# Patient Record
Sex: Female | Born: 1950 | Race: White | Hispanic: No | Marital: Single | Smoking: Current every day smoker
Health system: Southern US, Community
[De-identification: ages and names within clinical notes are randomized; demographics above are authoritative.]

## PROBLEM LIST (undated history)

## (undated) DIAGNOSIS — Z9889 Other specified postprocedural states: Secondary | ICD-10-CM

## (undated) DIAGNOSIS — I10 Essential (primary) hypertension: Secondary | ICD-10-CM

## (undated) DIAGNOSIS — F32A Depression, unspecified: Secondary | ICD-10-CM

## (undated) DIAGNOSIS — Z9109 Other allergy status, other than to drugs and biological substances: Secondary | ICD-10-CM

## (undated) DIAGNOSIS — R112 Nausea with vomiting, unspecified: Secondary | ICD-10-CM

## (undated) DIAGNOSIS — I2510 Atherosclerotic heart disease of native coronary artery without angina pectoris: Secondary | ICD-10-CM

## (undated) DIAGNOSIS — F329 Major depressive disorder, single episode, unspecified: Secondary | ICD-10-CM

## (undated) DIAGNOSIS — E119 Type 2 diabetes mellitus without complications: Secondary | ICD-10-CM

## (undated) DIAGNOSIS — I5189 Other ill-defined heart diseases: Secondary | ICD-10-CM

## (undated) DIAGNOSIS — I4891 Unspecified atrial fibrillation: Secondary | ICD-10-CM

## (undated) DIAGNOSIS — I509 Heart failure, unspecified: Secondary | ICD-10-CM

## (undated) DIAGNOSIS — K862 Cyst of pancreas: Secondary | ICD-10-CM

## (undated) HISTORY — PX: KNEE SURGERY: SHX244

## (undated) HISTORY — PX: CHOLECYSTECTOMY: SHX55

## (undated) HISTORY — PX: NECK SURGERY: SHX720

## (undated) HISTORY — PX: COLON SURGERY: SHX602

## (undated) HISTORY — PX: SMALL INTESTINE SURGERY: SHX150

## (undated) HISTORY — PX: ABDOMINAL HYSTERECTOMY: SHX81

## (undated) HISTORY — PX: APPENDECTOMY: SHX54

## (undated) HISTORY — PX: NASAL SINUS SURGERY: SHX719

## (undated) HISTORY — PX: CORONARY ANGIOPLASTY WITH STENT PLACEMENT: SHX49

---

## 2001-12-19 HISTORY — PX: JOINT REPLACEMENT: SHX530

## 2003-07-14 ENCOUNTER — Inpatient Hospital Stay (HOSPITAL_COMMUNITY): Admission: RE | Admit: 2003-07-14 | Discharge: 2003-07-15 | Payer: Self-pay | Admitting: Orthopaedic Surgery

## 2005-03-28 ENCOUNTER — Ambulatory Visit: Payer: Self-pay | Admitting: Family Medicine

## 2005-06-23 ENCOUNTER — Other Ambulatory Visit: Payer: Self-pay

## 2005-06-24 ENCOUNTER — Ambulatory Visit: Payer: Self-pay | Admitting: General Surgery

## 2006-01-31 ENCOUNTER — Ambulatory Visit: Payer: Self-pay

## 2006-07-06 ENCOUNTER — Ambulatory Visit: Payer: Self-pay | Admitting: *Deleted

## 2006-11-01 ENCOUNTER — Ambulatory Visit: Payer: Self-pay | Admitting: *Deleted

## 2008-02-07 ENCOUNTER — Ambulatory Visit: Payer: Self-pay | Admitting: *Deleted

## 2008-04-21 ENCOUNTER — Ambulatory Visit: Payer: Self-pay | Admitting: Gastroenterology

## 2009-10-27 ENCOUNTER — Ambulatory Visit: Payer: Self-pay | Admitting: Family Medicine

## 2010-02-04 ENCOUNTER — Ambulatory Visit: Payer: Self-pay | Admitting: Family Medicine

## 2011-04-22 ENCOUNTER — Ambulatory Visit: Payer: Self-pay | Admitting: Family Medicine

## 2014-10-15 ENCOUNTER — Ambulatory Visit: Payer: Self-pay | Admitting: Family Medicine

## 2016-11-29 DIAGNOSIS — I493 Ventricular premature depolarization: Secondary | ICD-10-CM | POA: Insufficient documentation

## 2017-01-25 ENCOUNTER — Other Ambulatory Visit: Payer: Self-pay | Admitting: Family Medicine

## 2017-01-25 DIAGNOSIS — Z13820 Encounter for screening for osteoporosis: Secondary | ICD-10-CM

## 2017-03-08 ENCOUNTER — Ambulatory Visit: Payer: Self-pay | Attending: Family Medicine

## 2017-05-06 ENCOUNTER — Emergency Department
Admission: EM | Admit: 2017-05-06 | Discharge: 2017-05-06 | Disposition: A | Discharge status: Home / Self Care | Payer: Medicare Other | Attending: Emergency Medicine | Admitting: Emergency Medicine

## 2017-05-06 ENCOUNTER — Encounter: Payer: Self-pay | Admitting: Emergency Medicine

## 2017-05-06 DIAGNOSIS — Z955 Presence of coronary angioplasty implant and graft: Secondary | ICD-10-CM | POA: Insufficient documentation

## 2017-05-06 DIAGNOSIS — E119 Type 2 diabetes mellitus without complications: Secondary | ICD-10-CM | POA: Diagnosis not present

## 2017-05-06 DIAGNOSIS — I10 Essential (primary) hypertension: Secondary | ICD-10-CM | POA: Insufficient documentation

## 2017-05-06 DIAGNOSIS — Y939 Activity, unspecified: Secondary | ICD-10-CM | POA: Insufficient documentation

## 2017-05-06 DIAGNOSIS — Z87891 Personal history of nicotine dependence: Secondary | ICD-10-CM | POA: Insufficient documentation

## 2017-05-06 DIAGNOSIS — W540XXA Bitten by dog, initial encounter: Secondary | ICD-10-CM | POA: Insufficient documentation

## 2017-05-06 DIAGNOSIS — Y929 Unspecified place or not applicable: Secondary | ICD-10-CM | POA: Diagnosis not present

## 2017-05-06 DIAGNOSIS — S61210A Laceration without foreign body of right index finger without damage to nail, initial encounter: Secondary | ICD-10-CM

## 2017-05-06 DIAGNOSIS — Y998 Other external cause status: Secondary | ICD-10-CM | POA: Insufficient documentation

## 2017-05-06 DIAGNOSIS — S61250A Open bite of right index finger without damage to nail, initial encounter: Secondary | ICD-10-CM | POA: Diagnosis not present

## 2017-05-06 DIAGNOSIS — I2510 Atherosclerotic heart disease of native coronary artery without angina pectoris: Secondary | ICD-10-CM | POA: Insufficient documentation

## 2017-05-06 DIAGNOSIS — S6991XA Unspecified injury of right wrist, hand and finger(s), initial encounter: Secondary | ICD-10-CM | POA: Diagnosis present

## 2017-05-06 HISTORY — DX: Essential (primary) hypertension: I10

## 2017-05-06 HISTORY — DX: Depression, unspecified: F32.A

## 2017-05-06 HISTORY — DX: Type 2 diabetes mellitus without complications: E11.9

## 2017-05-06 HISTORY — DX: Other ill-defined heart diseases: I51.89

## 2017-05-06 HISTORY — DX: Major depressive disorder, single episode, unspecified: F32.9

## 2017-05-06 HISTORY — DX: Atherosclerotic heart disease of native coronary artery without angina pectoris: I25.10

## 2017-05-06 HISTORY — DX: Other allergy status, other than to drugs and biological substances: Z91.09

## 2017-05-06 MED ORDER — AMOXICILLIN-POT CLAVULANATE 875-125 MG PO TABS: 1.0000 | ORAL_TABLET | Freq: Two times a day (BID) | ORAL | 0 refills | Status: DC

## 2017-05-06 MED ORDER — BACITRACIN ZINC 500 UNIT/GM EX OINT: TOPICAL_OINTMENT | Freq: Two times a day (BID) | CUTANEOUS | Status: DC

## 2017-05-06 MED ORDER — LIDOCAINE HCL (PF) 1 % IJ SOLN
5.0000 mL | Freq: Once | INTRAMUSCULAR | Status: AC
  Administered 2017-05-06: 5 mL
  Filled 2017-05-06: qty 5

## 2017-05-06 NOTE — Discharge Instructions (Signed)
Follow-up with primary care or return to the emergency department for suture removal and 10 days. Keep wound area clean, dry and covered.

## 2017-05-06 NOTE — ED Notes (Signed)
NAD noted at time of D/C. Pt denies questions or concerns. Pt ambulatory to the lobby at this time.  

## 2017-05-06 NOTE — ED Provider Notes (Signed)
[facility] Emergency Department Provider Note   ____________________________________________   I have reviewed the triage vital signs and the nursing notes.   HISTORY  Chief Complaint Animal Bite    HPI Jenny Hensley is a 66 y.o. female presents to the emergency department for a laceration on her right index finger she sustained after being bitten by her dog earlier today. Laceration is approximately 1.5 cm with some difficulty with hemorrhage control secondary to patient being on blood thinners secondary to cardiac stent placement. Patient reports canine is up-to-date on all vaccines include rabies vaccination. Patient has maintained pressure on the laceration to reduce hemorrhaging. She denies any loss of sensation to the digit at the time of the injury and since the injury. She denies any other injuries occurred. Patient denies fever, chills, headache, shortness of breath, abdominal pain, nausea and vomiting.  Past Medical History:  Diagnosis Date  . Coronary artery disease   . Depression   . Diabetes mellitus without complication ([place])   . Environmental allergies   . Heart chamber dysfunction   . Hypertension     There are no active problems to display for this patient.   Past Surgical History:  Procedure Laterality Date  . ABDOMINAL HYSTERECTOMY    . APPENDECTOMY    . CORONARY ANGIOPLASTY WITH STENT PLACEMENT    . KNEE SURGERY    . NECK SURGERY    . SMALL INTESTINE SURGERY      Prior to Admission medications   Medication Sig Start Date End Date Taking? Authorizing Provider  amoxicillin-clavulanate (AUGMENTIN) 875-125 MG tablet Take 1 tablet by mouth 2 (two) times daily. 05/06/17   Shanasia Ibrahim M, PA-C    Allergies Nsaids  History reviewed. No pertinent family history.  Social History Social History  Substance Use Topics  . Smoking status: Former Research scientist (life sciences)  . Smokeless tobacco: Never Used  . Alcohol use No    Review of  Systems Constitutional: Negative for fever/chills Eyes: No visual changes. Cardiovascular: Denies chest pain. Respiratory: Denies cough Denies shortness of breath. Musculoskeletal: Negative generalized body aches. Skin: Negative for rash. Laceration to the right index finger, DIP pad. Neurological: Negative for headaches.  Negative focal weakness or numbness. ____________________________________________   PHYSICAL EXAM:  VITAL SIGNS: ED Triage Vitals  Enc Vitals Group     BP 05/06/17 1809 (!) 181/66     Pulse Rate 05/06/17 1809 (!) 45     Resp 05/06/17 1809 18     Temp 05/06/17 1808 98.8 F (37.1 C)     Temp Source 05/06/17 1808 Oral     SpO2 05/06/17 1809 97 %     Weight 05/06/17 1808 190 lb (86.2 kg)     Height 05/06/17 1808 5\' 7"  (1.702 m)     Head Circumference --      Peak Flow --      Pain Score 05/06/17 1808 1     Pain Loc --      Pain Edu? --      Excl. in [place]? --     Constitutional: Alert and oriented. Well appearing and in no acute distress.  Head: Normocephalic and atraumatic. Eyes: Conjunctivae are normal.  Cardiovascular: Normal rate, regular rhythm. Normal distal pulses. Respiratory: Normal respiratory effort.  Musculoskeletal: Nontender with normal range of motion in all extremities. Neurologic: Normal speech and language. No gross focal neurologic deficits are appreciated. Right index finger sensation intact Skin:  Skin is warm, dry and intact. Except right index  finger DIP pad small laceration approximately 1.5 cm No rash noted. Psychiatric: Mood and affect are normal. ____________________________________________   LABS (all labs ordered are listed, but only abnormal results are displayed)  Labs Reviewed - No data to display ____________________________________________  EKG None ____________________________________________  RADIOLOGY None ____________________________________________   PROCEDURES  Procedure(s) performed: LACERATION  REPAIR Performed by: Jerolyn Shin Authorized by: Jerolyn Shin Consent: Verbal consent obtained. Risks and benefits: risks, benefits and alternatives were discussed Consent given by: patient Patient identity confirmed: provided demographic data Prepped and Draped in normal sterile fashion Wound explored  Laceration Location: Right index finger, DIP pad  Laceration Length: 1.5 cm  No Foreign Bodies seen or palpated  Anesthesia: local infiltration  Local anesthetic: lidocaine 1%   Anesthetic total: 2 ml  Irrigation method: syringe Amount of cleaning: standard  Skin closure: Ethilon 6.   Number of sutures: 3 sutures   Technique: Simple interrupted   Patient tolerance: Patient tolerated the procedure well with no immediate complications.    Critical Care performed: no ____________________________________________   INITIAL IMPRESSION / ASSESSMENT AND PLAN / ED COURSE  Pertinent labs & imaging results that were available during my care of the patient were reviewed by me and considered in my medical decision making (see chart for details).  Patient sustained a laceration the DIP pad right index finger approximately 1.5 cm secondary to being bitten by her her dog earlier today. Canine is up-to-date on all vaccinations to include rabies vaccine. Assessment confirmed movement and sensation of the digit before and after wound closure. Laceration required suture closure as noted above. Patient tolerated procedure well. Pt instructed to keep wound clean and dry and will return to the emergency department or PCP for suture removal in 10 days. Patient also instructed to watch for signs of infection and return if he noted changes of such. Patient will be provided prescription for prophylactic antibiotics.   Patient informed of clinical course, understand medical decision-making process, and agree with plan.  Patient was advised to follow up with PCP and was also advised to return  to the emergency department for symptoms that change or worsen if unable to schedule an appointment.     _________________________________________   FINAL CLINICAL IMPRESSION(S) / ED DIAGNOSES  Final diagnoses:  Laceration of right index finger without foreign body without damage to nail, initial encounter  Dog bite, initial encounter       NEW MEDICATIONS STARTED DURING THIS VISIT:  Discharge Medication List as of 05/06/2017  8:11 PM    START taking these medications   Details  amoxicillin-clavulanate (AUGMENTIN) 875-125 MG tablet Take 1 tablet by mouth 2 (two) times daily., Starting Sat 05/06/2017, Print         Note:  This document was prepared using Dragon voice recognition software and may include unintentional dictation errors.   Herberta Pickron, Laroy Apple, PA-C 05/06/17 2308    Jerolyn Shin, PA-C 05/06/17 2354    Carrie Mew, MD 05/07/17 [phone]

## 2017-05-06 NOTE — ED Notes (Signed)
Lidocaine and bacitracin given to pa

## 2017-05-06 NOTE — ED Triage Notes (Signed)
Pt reports her dog bit her last night. Small wound to right 2nd digit.  Pt c/o pain. Ambulatory to triage.

## 2017-07-12 ENCOUNTER — Other Ambulatory Visit: Payer: Self-pay | Admitting: Family Medicine

## 2017-07-12 DIAGNOSIS — Z1231 Encounter for screening mammogram for malignant neoplasm of breast: Secondary | ICD-10-CM

## 2017-07-18 ENCOUNTER — Ambulatory Visit
Admission: RE | Admit: 2017-07-18 | Discharge: 2017-07-18 | Disposition: A | Discharge status: Home / Self Care | Payer: Medicare Other | Source: Ambulatory Visit | Attending: Family Medicine | Admitting: Family Medicine

## 2017-07-18 DIAGNOSIS — Z1231 Encounter for screening mammogram for malignant neoplasm of breast: Secondary | ICD-10-CM | POA: Insufficient documentation

## 2017-07-23 DIAGNOSIS — E1169 Type 2 diabetes mellitus with other specified complication: Secondary | ICD-10-CM | POA: Insufficient documentation

## 2017-07-23 DIAGNOSIS — E1165 Type 2 diabetes mellitus with hyperglycemia: Secondary | ICD-10-CM | POA: Insufficient documentation

## 2017-07-23 DIAGNOSIS — Z794 Long term (current) use of insulin: Secondary | ICD-10-CM

## 2018-01-10 ENCOUNTER — Other Ambulatory Visit: Payer: Self-pay | Admitting: Orthopedic Surgery

## 2018-01-15 ENCOUNTER — Encounter
Admission: RE | Admit: 2018-01-15 | Discharge: 2018-01-15 | Disposition: A | Discharge status: Home / Self Care | Payer: Medicare Other | Source: Ambulatory Visit | Attending: Orthopedic Surgery | Admitting: Orthopedic Surgery

## 2018-01-15 ENCOUNTER — Other Ambulatory Visit: Payer: Self-pay

## 2018-01-15 DIAGNOSIS — Z79899 Other long term (current) drug therapy: Secondary | ICD-10-CM | POA: Diagnosis not present

## 2018-01-15 DIAGNOSIS — E119 Type 2 diabetes mellitus without complications: Secondary | ICD-10-CM | POA: Diagnosis not present

## 2018-01-15 DIAGNOSIS — Z886 Allergy status to analgesic agent status: Secondary | ICD-10-CM | POA: Diagnosis not present

## 2018-01-15 DIAGNOSIS — Z955 Presence of coronary angioplasty implant and graft: Secondary | ICD-10-CM | POA: Diagnosis not present

## 2018-01-15 DIAGNOSIS — M7552 Bursitis of left shoulder: Secondary | ICD-10-CM | POA: Diagnosis not present

## 2018-01-15 DIAGNOSIS — Z794 Long term (current) use of insulin: Secondary | ICD-10-CM | POA: Diagnosis not present

## 2018-01-15 DIAGNOSIS — M75122 Complete rotator cuff tear or rupture of left shoulder, not specified as traumatic: Secondary | ICD-10-CM | POA: Diagnosis not present

## 2018-01-15 DIAGNOSIS — F17210 Nicotine dependence, cigarettes, uncomplicated: Secondary | ICD-10-CM | POA: Diagnosis not present

## 2018-01-15 DIAGNOSIS — I4891 Unspecified atrial fibrillation: Secondary | ICD-10-CM | POA: Diagnosis not present

## 2018-01-15 DIAGNOSIS — I2510 Atherosclerotic heart disease of native coronary artery without angina pectoris: Secondary | ICD-10-CM | POA: Diagnosis not present

## 2018-01-15 DIAGNOSIS — F329 Major depressive disorder, single episode, unspecified: Secondary | ICD-10-CM | POA: Diagnosis not present

## 2018-01-15 DIAGNOSIS — I10 Essential (primary) hypertension: Secondary | ICD-10-CM | POA: Diagnosis not present

## 2018-01-15 DIAGNOSIS — Z7901 Long term (current) use of anticoagulants: Secondary | ICD-10-CM | POA: Diagnosis not present

## 2018-01-15 HISTORY — DX: Other specified postprocedural states: Z98.890

## 2018-01-15 HISTORY — DX: Nausea with vomiting, unspecified: R11.2

## 2018-01-15 HISTORY — DX: Unspecified atrial fibrillation: I48.91

## 2018-01-15 LAB — CBC WITH DIFFERENTIAL/PLATELET
BASOS ABS: 0 10*3/uL (ref 0–0.1)
Basophils Relative: 0 %
Eosinophils Absolute: 0.3 10*3/uL (ref 0–0.7)
Eosinophils Relative: 4 %
HCT: 43.9 % (ref 35.0–47.0)
HEMOGLOBIN: 14.5 g/dL (ref 12.0–16.0)
LYMPHS ABS: 2.6 10*3/uL (ref 1.0–3.6)
Lymphocytes Relative: 30 %
MCH: 31.5 pg (ref 26.0–34.0)
MCHC: 33 g/dL (ref 32.0–36.0)
MCV: 95.3 fL (ref 80.0–100.0)
Monocytes Absolute: 0.6 10*3/uL (ref 0.2–0.9)
Monocytes Relative: 7 %
NEUTROS PCT: 59 %
Neutro Abs: 5.1 10*3/uL (ref 1.4–6.5)
Platelets: 207 10*3/uL (ref 150–440)
RBC: 4.6 MIL/uL (ref 3.80–5.20)
RDW: 14.1 % (ref 11.5–14.5)
WBC: 8.6 10*3/uL (ref 3.6–11.0)

## 2018-01-15 LAB — BASIC METABOLIC PANEL
Anion gap: 6 (ref 5–15)
BUN: 32 mg/dL — AB (ref 6–20)
CO2: 27 mmol/L (ref 22–32)
CREATININE: 0.84 mg/dL (ref 0.44–1.00)
Calcium: 9.1 mg/dL (ref 8.9–10.3)
Chloride: 101 mmol/L (ref 101–111)
Glucose, Bld: 381 mg/dL — ABNORMAL HIGH (ref 65–99)
POTASSIUM: 4.3 mmol/L (ref 3.5–5.1)
SODIUM: 134 mmol/L — AB (ref 135–145)

## 2018-01-15 LAB — PROTIME-INR
INR: 1.03
PROTHROMBIN TIME: 13.4 s (ref 11.4–15.2)

## 2018-01-15 LAB — APTT: APTT: 32 s (ref 24–36)

## 2018-01-15 NOTE — Patient Instructions (Signed)
Your procedure is scheduled on: Thursday 01/18/18 Report to [facility]. To find out your arrival time please call [phone] between 1PM - 3PM on Wednesday 01/17/18.  Remember: Instructions that are not followed completely may result in serious medical risk, up to and including death, or upon the discretion of your surgeon and anesthesiologist your surgery may need to be rescheduled.     _X__ 1. Do not eat food after midnight the night before your procedure.                 No gum chewing or hard candies. You may drink clear liquids up to 2 hours                 before you are scheduled to arrive for your surgery- DO not drink clear                 liquids within 2 hours of the start of your surgery.                 Clear Liquids include:  water, apple juice without pulp, clear carbohydrate                 drink such as Clearfast of Gartorade, Black Coffee or Tea (Do not add                 anything to coffee or tea).  __X__2.  On the morning of surgery brush your teeth with toothpaste and water, you                 may rinse your mouth with mouthwash if you wish.  Do not swallow any              toothpaste of mouthwash.     _X__ 3.  No Alcohol for 24 hours before or after surgery.   _X__ 4.  Do Not Smoke or use e-cigarettes For 24 Hours Prior to Your Surgery.                 Do not use any chewable tobacco products for at least 6 hours prior to                 surgery.  ____  5.  Bring all medications with you on the day of surgery if instructed.   ____  6.  Notify your doctor if there is any change in your medical condition      (cold, fever, infections).     Do not wear jewelry, make-up, hairpins, clips or nail polish. Do not wear lotions, powders, or perfumes. You may wear deodorant. Do not shave 48 hours prior to surgery. Men may shave face and neck. Do not bring valuables to the hospital.    [facility] is not responsible for any belongings or valuables.  Contacts, dentures or bridgework may not be worn into surgery. Leave your suitcase in the car. After surgery it may be brought to your room. For patients admitted to the hospital, discharge time is determined by your treatment team.   Patients discharged the day of surgery will not be allowed to drive home.   Please read over the following fact sheets that you were given:   MRSA Information   __X__ Take these medicines the morning of surgery with A SIP OF WATER:    1. BUPROPION  2. ZETIA  3. METOPROLOL  4.  5.  6.  ____ Fleet Enema (as directed)   __X__ Use CHG Soap as directed  ____ Use inhalers on the day of surgery  ____ Stop metformin 2 days prior to surgery    __X__ Take 1/2 of usual insulin dose the night before surgery. No insulin the morning          of surgery.   __X__ Stop Coumadin/Plavix/aspirin on (XARELTO STOPPED 01/15/18)  __X__ Stop Anti-inflammatories on TODAY (IBUPROFEN)   ____ Stop supplements until after surgery.    ____ Bring C-Pap to the hospital.

## 2018-01-16 NOTE — Pre-Procedure Instructions (Signed)
Met B results sent to Dr. Mack Guise and Anesthesia for review.  Also requested H&P.

## 2018-01-16 NOTE — Pre-Procedure Instructions (Signed)
AS INSTRUCTED BY DR PISCITELLO, REQUEST/LABS CALLED AND FAXED TO DR L MILES. SPOKE WITH FRANCISCO. ALSO CALLED AND FAXED TO Ismeal Heider AT [facility]. NEEDS POPTIMIZATION OF DIABETES PREOP

## 2018-01-17 ENCOUNTER — Encounter
Admission: RE | Admit: 2018-01-17 | Discharge: 2018-01-17 | Disposition: A | Discharge status: Home / Self Care | Payer: Medicare Other | Source: Ambulatory Visit | Attending: Orthopedic Surgery | Admitting: Orthopedic Surgery

## 2018-01-17 ENCOUNTER — Other Ambulatory Visit: Payer: Self-pay | Admitting: Orthopedic Surgery

## 2018-01-17 DIAGNOSIS — M75122 Complete rotator cuff tear or rupture of left shoulder, not specified as traumatic: Secondary | ICD-10-CM | POA: Diagnosis not present

## 2018-01-17 LAB — HEMOGLOBIN A1C
HEMOGLOBIN A1C: 9.3 % — AB (ref 4.8–5.6)
Mean Plasma Glucose: 220.21 mg/dL

## 2018-01-17 MED ORDER — CEFAZOLIN SODIUM-DEXTROSE 2-4 GM/100ML-% IV SOLN
2.0000 g | INTRAVENOUS | Status: AC
  Administered 2018-01-18: 2 g via INTRAVENOUS

## 2018-01-17 NOTE — Pre-Procedure Instructions (Signed)
  Kerin Perna, RN  Registered Nurse  Anesthesiology  Pre-Procedure Instructions  Signed  Date of Service:  01/17/2018 2:10 PM       Related encounter: PRE-ADMISSION TESTING from 01/17/2018 in [place]           [] Hide copied text  [] Hover for details   Spoke with Luria Rosario at [facility] this am. CLEARANCE REQUEST SENT BY THEM TO ENDOCRINOLOGIST WHO WILL NOT CLEAR. PATIENT NEVER RETURNED FOR APPT 9/18. DR Volney American HGBAIC DONE TODAY AND RESULTS TO HIM AS SOON AS POSSIBLE.           NOTIFIED Idalie Canto AT [facility] AS WELL ORDERED WITH [email or web address].WAS TO CALL DR Mack Guise WITH GLU RESULTS ASAP. CALLED LAB 2 HOURS AFTER LABS DRAWN FOR RESULTS. LAB HAD NOT RECEIVED BLOOD. WHEN BLOOD RECEIVED,LAB CALLED AND GREEN TUBE FOR GLU. NOT SENT TO LAB. NOTIFIED [facility] [place]. WILL DO POCT GLUCOSE ON ARRIVAL

## 2018-01-17 NOTE — Pre-Procedure Instructions (Signed)
Spoke with Jenny Hensley at [facility] this am. CLEARANCE REQUEST SENT BY THEM TO ENDOCRINOLOGIST WHO WILL NOT CLEAR. PATIENT NEVER RETURNED FOR APPT 9/18. DR Volney American HGBAIC DONE TODAY AND RESULTS TO HIM AS SOON AS POSSIBLE.

## 2018-01-18 ENCOUNTER — Other Ambulatory Visit: Payer: Self-pay

## 2018-01-18 ENCOUNTER — Ambulatory Visit
Admission: RE | Admit: 2018-01-18 | Discharge: 2018-01-18 | Disposition: A | Discharge status: Home / Self Care | Payer: Medicare Other | Source: Ambulatory Visit | Attending: Orthopedic Surgery | Admitting: Orthopedic Surgery

## 2018-01-18 ENCOUNTER — Ambulatory Visit: Payer: Medicare Other | Admitting: Anesthesiology

## 2018-01-18 ENCOUNTER — Encounter: Payer: Self-pay | Admitting: *Deleted

## 2018-01-18 ENCOUNTER — Encounter
Admission: RE | Disposition: A | Discharge status: Home / Self Care | Payer: Self-pay | Source: Ambulatory Visit | Attending: Orthopedic Surgery

## 2018-01-18 DIAGNOSIS — Z955 Presence of coronary angioplasty implant and graft: Secondary | ICD-10-CM | POA: Insufficient documentation

## 2018-01-18 DIAGNOSIS — I2510 Atherosclerotic heart disease of native coronary artery without angina pectoris: Secondary | ICD-10-CM | POA: Insufficient documentation

## 2018-01-18 DIAGNOSIS — I10 Essential (primary) hypertension: Secondary | ICD-10-CM | POA: Insufficient documentation

## 2018-01-18 DIAGNOSIS — Z794 Long term (current) use of insulin: Secondary | ICD-10-CM | POA: Insufficient documentation

## 2018-01-18 DIAGNOSIS — M75122 Complete rotator cuff tear or rupture of left shoulder, not specified as traumatic: Secondary | ICD-10-CM | POA: Insufficient documentation

## 2018-01-18 DIAGNOSIS — E119 Type 2 diabetes mellitus without complications: Secondary | ICD-10-CM | POA: Insufficient documentation

## 2018-01-18 DIAGNOSIS — F329 Major depressive disorder, single episode, unspecified: Secondary | ICD-10-CM | POA: Insufficient documentation

## 2018-01-18 DIAGNOSIS — F17210 Nicotine dependence, cigarettes, uncomplicated: Secondary | ICD-10-CM | POA: Insufficient documentation

## 2018-01-18 DIAGNOSIS — M7552 Bursitis of left shoulder: Secondary | ICD-10-CM | POA: Insufficient documentation

## 2018-01-18 DIAGNOSIS — Z79899 Other long term (current) drug therapy: Secondary | ICD-10-CM | POA: Insufficient documentation

## 2018-01-18 DIAGNOSIS — I4891 Unspecified atrial fibrillation: Secondary | ICD-10-CM | POA: Insufficient documentation

## 2018-01-18 DIAGNOSIS — Z886 Allergy status to analgesic agent status: Secondary | ICD-10-CM | POA: Insufficient documentation

## 2018-01-18 DIAGNOSIS — Z7901 Long term (current) use of anticoagulants: Secondary | ICD-10-CM | POA: Insufficient documentation

## 2018-01-18 HISTORY — PX: SHOULDER ARTHROSCOPY WITH OPEN ROTATOR CUFF REPAIR: SHX6092

## 2018-01-18 LAB — GLUCOSE, CAPILLARY
Glucose-Capillary: 209 mg/dL — ABNORMAL HIGH (ref 65–99)
Glucose-Capillary: 282 mg/dL — ABNORMAL HIGH (ref 65–99)

## 2018-01-18 SURGERY — ARTHROSCOPY, SHOULDER WITH REPAIR, ROTATOR CUFF, OPEN
Anesthesia: General | Site: Shoulder | Laterality: Left | Wound class: Clean

## 2018-01-18 MED ORDER — MIDAZOLAM HCL 2 MG/2ML IJ SOLN
INTRAMUSCULAR | Status: AC
  Filled 2018-01-18: qty 2

## 2018-01-18 MED ORDER — BUPIVACAINE HCL (PF) 0.25 % IJ SOLN
INTRAMUSCULAR | Status: DC | PRN
  Administered 2018-01-18: 30 mL

## 2018-01-18 MED ORDER — CHLORHEXIDINE GLUCONATE CLOTH 2 % EX PADS: 6.0000 | MEDICATED_PAD | Freq: Once | CUTANEOUS | Status: DC

## 2018-01-18 MED ORDER — ONDANSETRON HCL 4 MG/2ML IJ SOLN
INTRAMUSCULAR | Status: AC
  Filled 2018-01-18: qty 2

## 2018-01-18 MED ORDER — FAMOTIDINE 20 MG PO TABS
20.0000 mg | ORAL_TABLET | Freq: Once | ORAL | Status: AC
  Administered 2018-01-18: 20 mg via ORAL

## 2018-01-18 MED ORDER — BUPIVACAINE HCL (PF) 0.25 % IJ SOLN
INTRAMUSCULAR | Status: AC
  Filled 2018-01-18: qty 30

## 2018-01-18 MED ORDER — MIDAZOLAM HCL 2 MG/2ML IJ SOLN
2.0000 mg | Freq: Once | INTRAMUSCULAR | Status: AC
  Administered 2018-01-18: 2 mg via INTRAVENOUS

## 2018-01-18 MED ORDER — SODIUM CHLORIDE FLUSH 0.9 % IV SOLN
INTRAVENOUS | Status: AC
  Filled 2018-01-18: qty 10

## 2018-01-18 MED ORDER — LIDOCAINE HCL (PF) 1.5 % IJ SOLN
INTRAMUSCULAR | Status: DC | PRN
  Administered 2018-01-18: 3 mL via INTRADERMAL

## 2018-01-18 MED ORDER — LACTATED RINGERS IV SOLN
INTRAVENOUS | Status: DC | PRN
  Administered 2018-01-18: 08:00:00 via INTRAVENOUS

## 2018-01-18 MED ORDER — PROPOFOL 10 MG/ML IV BOLUS
INTRAVENOUS | Status: DC | PRN
  Administered 2018-01-18: 130 mg via INTRAVENOUS

## 2018-01-18 MED ORDER — NEOMYCIN-POLYMYXIN B GU 40-200000 IR SOLN
Status: DC | PRN
  Administered 2018-01-18: 2 mL

## 2018-01-18 MED ORDER — CEFAZOLIN SODIUM-DEXTROSE 2-4 GM/100ML-% IV SOLN
INTRAVENOUS | Status: AC
  Filled 2018-01-18: qty 100

## 2018-01-18 MED ORDER — INSULIN ASPART 100 UNIT/ML ~~LOC~~ SOLN
SUBCUTANEOUS | Status: AC
  Administered 2018-01-18: 5 [IU] via SUBCUTANEOUS
  Filled 2018-01-18: qty 1

## 2018-01-18 MED ORDER — FENTANYL CITRATE (PF) 100 MCG/2ML IJ SOLN
INTRAMUSCULAR | Status: DC | PRN
  Administered 2018-01-18 (×2): 50 ug via INTRAVENOUS

## 2018-01-18 MED ORDER — PHENYLEPHRINE HCL 10 MG/ML IJ SOLN
INTRAMUSCULAR | Status: DC | PRN
  Administered 2018-01-18 (×7): 100 ug via INTRAVENOUS

## 2018-01-18 MED ORDER — SUGAMMADEX SODIUM 200 MG/2ML IV SOLN
INTRAVENOUS | Status: DC | PRN
  Administered 2018-01-18: 199.6 mg via INTRAVENOUS

## 2018-01-18 MED ORDER — FENTANYL CITRATE (PF) 100 MCG/2ML IJ SOLN: 25.0000 ug | INTRAMUSCULAR | Status: DC | PRN

## 2018-01-18 MED ORDER — SUCCINYLCHOLINE CHLORIDE 20 MG/ML IJ SOLN
INTRAMUSCULAR | Status: DC | PRN
  Administered 2018-01-18: 100 mg via INTRAVENOUS

## 2018-01-18 MED ORDER — EPINEPHRINE 30 MG/30ML IJ SOLN
INTRAMUSCULAR | Status: AC
  Filled 2018-01-18: qty 1

## 2018-01-18 MED ORDER — FENTANYL CITRATE (PF) 250 MCG/5ML IJ SOLN
INTRAMUSCULAR | Status: AC
  Filled 2018-01-18: qty 5

## 2018-01-18 MED ORDER — ROPIVACAINE HCL 5 MG/ML IJ SOLN
INTRAMUSCULAR | Status: DC | PRN
  Administered 2018-01-18: 30 mL via PERINEURAL

## 2018-01-18 MED ORDER — SUGAMMADEX SODIUM 200 MG/2ML IV SOLN
INTRAVENOUS | Status: AC
  Filled 2018-01-18: qty 2

## 2018-01-18 MED ORDER — NEOMYCIN-POLYMYXIN B GU 40-200000 IR SOLN
Status: AC
  Filled 2018-01-18: qty 2

## 2018-01-18 MED ORDER — CEFAZOLIN SODIUM-DEXTROSE 2-4 GM/100ML-% IV SOLN: 2.0000 g | INTRAVENOUS | Status: DC

## 2018-01-18 MED ORDER — SCOPOLAMINE 1 MG/3DAYS TD PT72
MEDICATED_PATCH | TRANSDERMAL | Status: AC
  Administered 2018-01-18: 1.5 mg via TRANSDERMAL
  Filled 2018-01-18: qty 1

## 2018-01-18 MED ORDER — METOPROLOL TARTRATE 5 MG/5ML IV SOLN
INTRAVENOUS | Status: DC | PRN
  Administered 2018-01-18 (×4): 1 mg via INTRAVENOUS

## 2018-01-18 MED ORDER — PROMETHAZINE HCL 25 MG/ML IJ SOLN
12.5000 mg | Freq: Once | INTRAMUSCULAR | Status: AC
  Administered 2018-01-18: 12.5 mg via INTRAVENOUS

## 2018-01-18 MED ORDER — LIDOCAINE HCL (CARDIAC) 20 MG/ML IV SOLN
INTRAVENOUS | Status: DC | PRN
  Administered 2018-01-18: 40 mg via INTRAVENOUS

## 2018-01-18 MED ORDER — MIDAZOLAM HCL 2 MG/2ML IJ SOLN: 1.0000 mg | Freq: Once | INTRAMUSCULAR | Status: DC

## 2018-01-18 MED ORDER — PROPOFOL 10 MG/ML IV BOLUS
INTRAVENOUS | Status: AC
  Filled 2018-01-18: qty 20

## 2018-01-18 MED ORDER — FAMOTIDINE 20 MG PO TABS
ORAL_TABLET | ORAL | Status: AC
  Administered 2018-01-18: 20 mg via ORAL
  Filled 2018-01-18: qty 1

## 2018-01-18 MED ORDER — SUCCINYLCHOLINE CHLORIDE 20 MG/ML IJ SOLN
INTRAMUSCULAR | Status: AC
  Filled 2018-01-18: qty 1

## 2018-01-18 MED ORDER — PROMETHAZINE HCL 25 MG/ML IJ SOLN
INTRAMUSCULAR | Status: AC
  Administered 2018-01-18: 12.5 mg via INTRAVENOUS
  Filled 2018-01-18: qty 1

## 2018-01-18 MED ORDER — OXYCODONE HCL 5 MG PO TABS: 5.0000 mg | ORAL_TABLET | ORAL | 0 refills | Status: DC | PRN

## 2018-01-18 MED ORDER — SCOPOLAMINE 1 MG/3DAYS TD PT72
1.0000 | MEDICATED_PATCH | TRANSDERMAL | Status: DC
  Administered 2018-01-18: 1.5 mg via TRANSDERMAL

## 2018-01-18 MED ORDER — SEVOFLURANE IN SOLN
RESPIRATORY_TRACT | Status: AC
  Filled 2018-01-18: qty 250

## 2018-01-18 MED ORDER — EPINEPHRINE PF 1 MG/ML IJ SOLN
INTRAMUSCULAR | Status: DC | PRN
  Administered 2018-01-18: 10 mL

## 2018-01-18 MED ORDER — LIDOCAINE HCL 2 % EX GEL
CUTANEOUS | Status: DC | PRN
  Administered 2018-01-18: 3

## 2018-01-18 MED ORDER — PHENYLEPHRINE 8 MG IN D5W 100 ML (0.08MG/ML) PREMIX OPTIME
INJECTION | INTRAVENOUS | Status: DC | PRN
  Administered 2018-01-18: 30 ug/min via INTRAVENOUS

## 2018-01-18 MED ORDER — ONDANSETRON HCL 4 MG PO TABS: 4.0000 mg | ORAL_TABLET | Freq: Three times a day (TID) | ORAL | 0 refills | Status: DC | PRN

## 2018-01-18 MED ORDER — SODIUM CHLORIDE 0.9 % IV SOLN
INTRAVENOUS | Status: DC
  Administered 2018-01-18: 07:00:00 via INTRAVENOUS

## 2018-01-18 MED ORDER — DEXAMETHASONE SODIUM PHOSPHATE 10 MG/ML IJ SOLN
INTRAMUSCULAR | Status: DC | PRN
  Administered 2018-01-18: 8 mg via INTRAVENOUS

## 2018-01-18 MED ORDER — ROPIVACAINE HCL 5 MG/ML IJ SOLN
INTRAMUSCULAR | Status: AC
  Filled 2018-01-18: qty 30

## 2018-01-18 MED ORDER — LIDOCAINE HCL (PF) 1 % IJ SOLN
INTRAMUSCULAR | Status: AC
  Filled 2018-01-18: qty 30

## 2018-01-18 MED ORDER — ROCURONIUM BROMIDE 100 MG/10ML IV SOLN
INTRAVENOUS | Status: DC | PRN
  Administered 2018-01-18: 10 mg via INTRAVENOUS
  Administered 2018-01-18: 45 mg via INTRAVENOUS
  Administered 2018-01-18: 5 mg via INTRAVENOUS

## 2018-01-18 MED ORDER — INSULIN ASPART 100 UNIT/ML ~~LOC~~ SOLN
5.0000 [IU] | Freq: Once | SUBCUTANEOUS | Status: AC
  Administered 2018-01-18: 5 [IU] via SUBCUTANEOUS

## 2018-01-18 MED ORDER — ACETAMINOPHEN 10 MG/ML IV SOLN
INTRAVENOUS | Status: AC
  Filled 2018-01-18: qty 100

## 2018-01-18 MED ORDER — ROCURONIUM BROMIDE 50 MG/5ML IV SOLN
INTRAVENOUS | Status: AC
  Filled 2018-01-18: qty 1

## 2018-01-18 MED ORDER — FENTANYL CITRATE (PF) 100 MCG/2ML IJ SOLN
INTRAMUSCULAR | Status: AC
  Filled 2018-01-18: qty 2

## 2018-01-18 MED ORDER — FENTANYL CITRATE (PF) 100 MCG/2ML IJ SOLN: 50.0000 ug | Freq: Once | INTRAMUSCULAR | Status: DC

## 2018-01-18 MED ORDER — MIDAZOLAM HCL 2 MG/2ML IJ SOLN
INTRAMUSCULAR | Status: AC
  Administered 2018-01-18: 2 mg via INTRAVENOUS
  Filled 2018-01-18: qty 2

## 2018-01-18 MED ORDER — LIDOCAINE HCL (PF) 1 % IJ SOLN
INTRAMUSCULAR | Status: AC
  Filled 2018-01-18: qty 5

## 2018-01-18 MED ORDER — ONDANSETRON HCL 4 MG/2ML IJ SOLN
4.0000 mg | Freq: Once | INTRAMUSCULAR | Status: AC | PRN
  Administered 2018-01-18: 4 mg via INTRAVENOUS

## 2018-01-18 MED ORDER — ACETAMINOPHEN 10 MG/ML IV SOLN
INTRAVENOUS | Status: DC | PRN
  Administered 2018-01-18: 1000 mg via INTRAVENOUS

## 2018-01-18 SURGICAL SUPPLY — 76 items
ADAPTER IRRIG TUBE 2 SPIKE SOL (ADAPTER) ×6 IMPLANT
ANCHOR ALL-SUT Q-FIX 2.8 (Anchor) ×3 IMPLANT
BUR RADIUS 4.0X18.5 (BURR) ×3 IMPLANT
BUR RADIUS 5.5 (BURR) ×3 IMPLANT
CANNULA 5.75X7 CRYSTAL CLEAR (CANNULA) ×6 IMPLANT
CANNULA PARTIAL THREAD 2X7 (CANNULA) ×3 IMPLANT
CANNULA TWIST IN 8.25X9CM (CANNULA) IMPLANT
CLOSURE WOUND 1/2 X4 (GAUZE/BANDAGES/DRESSINGS) ×2
CONNECTOR PERFECT PASSER (CONNECTOR) ×3 IMPLANT
COOLER POLAR GLACIER W/PUMP (MISCELLANEOUS) ×3 IMPLANT
CRADLE LAMINECT ARM (MISCELLANEOUS) ×3 IMPLANT
DEVICE SUCT BLK HOLE OR FLOOR (MISCELLANEOUS) ×3 IMPLANT
DRAPE IMP U-DRAPE 54X76 (DRAPES) ×6 IMPLANT
DRAPE INCISE IOBAN 66X45 STRL (DRAPES) ×3 IMPLANT
DRAPE SHEET LG 3/4 BI-LAMINATE (DRAPES) ×3 IMPLANT
DRAPE U-SHAPE 47X51 STRL (DRAPES) IMPLANT
DURAPREP 26ML APPLICATOR (WOUND CARE) ×12 IMPLANT
ELECT REM PT RETURN 9FT ADLT (ELECTROSURGICAL) ×3
ELECTRODE REM PT RTRN 9FT ADLT (ELECTROSURGICAL) ×1 IMPLANT
GAUZE PETRO XEROFOAM 1X8 (MISCELLANEOUS) ×3 IMPLANT
GAUZE SPONGE 4X4 12PLY STRL (GAUZE/BANDAGES/DRESSINGS) ×6 IMPLANT
GLOVE BIOGEL PI IND STRL 7.0 (GLOVE) ×5 IMPLANT
GLOVE BIOGEL PI IND STRL 9 (GLOVE) ×1 IMPLANT
GLOVE BIOGEL PI INDICATOR 7.0 (GLOVE) ×10
GLOVE BIOGEL PI INDICATOR 9 (GLOVE) ×2
GLOVE PROTEXIS LATEX SZ 7.5 (GLOVE) ×15 IMPLANT
GLOVE SURG 9.0 ORTHO LTXF (GLOVE) ×6 IMPLANT
GOWN STRL REUS TWL 2XL XL LVL4 (GOWN DISPOSABLE) ×3 IMPLANT
GOWN STRL REUS W/ TWL LRG LVL3 (GOWN DISPOSABLE) ×1 IMPLANT
GOWN STRL REUS W/ TWL LRG LVL4 (GOWN DISPOSABLE) ×3 IMPLANT
GOWN STRL REUS W/TWL LRG LVL3 (GOWN DISPOSABLE) ×2
GOWN STRL REUS W/TWL LRG LVL4 (GOWN DISPOSABLE) ×6
IV LACTATED RINGER IRRG 3000ML (IV SOLUTION) ×20
IV LR IRRIG 3000ML ARTHROMATIC (IV SOLUTION) ×10 IMPLANT
KIT RM TURNOVER STRD PROC AR (KITS) ×3 IMPLANT
KIT STABILIZATION SHOULDER (MISCELLANEOUS) ×3 IMPLANT
KIT SUTURE 2.8 Q-FIX DISP (MISCELLANEOUS) ×3 IMPLANT
KIT SUTURETAK 3.0 INSERT PERC (KITS) IMPLANT
MANIFOLD NEPTUNE II (INSTRUMENTS) ×3 IMPLANT
MASK FACE SPIDER DISP (MASK) ×3 IMPLANT
MAT BLUE FLOOR 46X72 FLO (MISCELLANEOUS) ×6 IMPLANT
NDL SAFETY ECLIPSE 18X1.5 (NEEDLE) ×1 IMPLANT
NEEDLE HYPO 18GX1.5 SHARP (NEEDLE) ×2
NEEDLE HYPO 22GX1.5 SAFETY (NEEDLE) ×3 IMPLANT
NS IRRIG 500ML POUR BTL (IV SOLUTION) ×3 IMPLANT
PACK ARTHROSCOPY SHOULDER (MISCELLANEOUS) ×3 IMPLANT
PAD WRAPON POLAR SHDR XLG (MISCELLANEOUS) ×1 IMPLANT
PASSER SUT CAPTURE FIRST (SUTURE) ×3 IMPLANT
SET TUBE SUCT SHAVER OUTFL 24K (TUBING) ×3 IMPLANT
SET TUBE TIP INTRA-ARTICULAR (MISCELLANEOUS) ×3 IMPLANT
SLING ULTRA II M (MISCELLANEOUS) ×3 IMPLANT
STRAP SAFETY BODY (MISCELLANEOUS) ×3 IMPLANT
STRIP CLOSURE SKIN 1/2X4 (GAUZE/BANDAGES/DRESSINGS) ×4 IMPLANT
SUT ETHILON 4-0 (SUTURE) ×4
SUT ETHILON 4-0 FS2 18XMFL BLK (SUTURE) ×2
SUT KNTLS 2.8 MAGNUM (Anchor) ×6 IMPLANT
SUT LASSO 90 DEG SD STR (SUTURE) IMPLANT
SUT MNCRL 4-0 (SUTURE) ×2
SUT MNCRL 4-0 27XMFL (SUTURE) ×1
SUT PDS AB 0 CT1 27 (SUTURE) ×3 IMPLANT
SUT PERFECTPASSER WHITE CART (SUTURE) IMPLANT
SUT SMART STITCH CARTRIDGE (SUTURE) ×6 IMPLANT
SUT VIC AB 0 CT1 36 (SUTURE) ×3 IMPLANT
SUT VIC AB 2-0 CT2 27 (SUTURE) ×3 IMPLANT
SUTURE ETHLN 4-0 FS2 18XMF BLK (SUTURE) ×2 IMPLANT
SUTURE MAGNUM WIRE 2X48 BLK (SUTURE) IMPLANT
SUTURE MNCRL 4-0 27XMF (SUTURE) ×1 IMPLANT
SYR 10ML 18GX1 1/2 (NEEDLE) ×3 IMPLANT
SYR 10ML LL (SYRINGE) ×3 IMPLANT
SYR 3ML 18GX1 1/2 (SYRINGE) ×3 IMPLANT
TAPE MICROFOAM 4IN (TAPE) ×3 IMPLANT
TUBING ARTHRO INFLOW-ONLY STRL (TUBING) ×3 IMPLANT
TUBING CONNECTING 10 (TUBING) ×2 IMPLANT
TUBING CONNECTING 10' (TUBING) ×1
WAND HAND CNTRL MULTIVAC 90 (MISCELLANEOUS) ×3 IMPLANT
WRAPON POLAR PAD SHDR XLG (MISCELLANEOUS) ×3

## 2018-01-18 NOTE — Anesthesia Post-op Follow-up Note (Signed)
Anesthesia QCDR form completed.        

## 2018-01-18 NOTE — Anesthesia Procedure Notes (Signed)
Anesthesia Regional Block: Interscalene brachial plexus block   Pre-Anesthetic Checklist: ,, timeout performed, Correct Patient, Correct Site, Correct Laterality, Correct Procedure, Correct Position, site marked, Risks and benefits discussed,  Surgical consent,  Pre-op evaluation,  At surgeon's request and post-op pain management  Laterality: Left  Prep: chloraprep       Needles:  Injection technique: Single-shot  Needle Type: Echogenic Stimulator Needle     Needle Length: 10cm  Needle Gauge: 22     Additional Needles:   Procedures:, nerve stimulator,,, ultrasound used (permanent image in chart),,,,   Nerve Stimulator or Paresthesia:  Response: biceps flexion,   Additional Responses:   Narrative:  Injection made incrementally with aspirations every 5 mL.  Performed by: Personally  Anesthesiologist: Molli Barrows, MD  Additional Notes: Functioning IV was confirmed and monitors were applied.  A 50mm 22ga Stimuplex needle was used. Sterile prep and drape,hand hygiene and sterile gloves were used.  Negative aspiration and negative test dose prior to incremental administration of local anesthetic. The patient tolerated the procedure well.

## 2018-01-18 NOTE — Discharge Instructions (Signed)
Interscalene Nerve Block (ISNB) Discharge Instructions   1.  For your surgery you have received an Interscalene Nerve Block. 2. Nerve Blocks affect many types of nerves, including nerves that control movement, pain and normal sensation.  You may experience feelings such as numbness, tingling, heaviness, weakness or the inability to move your arm or the feeling or sensation that your arm has "fallen asleep". 3. A nerve block can last for 2 - 36 hours or more depending on the medication used.  Usually the weakness wears off first.  The tingling and heaviness usually wear off next.  Finally you may start to notice pain.  Keep in mind that this may occur in any order.  once a nerve block starts to wear off it is usually completely gone within 60 minutes. 4. ISNB may cause mild shortness of breath, a hoarse voice, blurry vision, unequal pupils, or drooping of the face on the same side as the nerve block.  These symptoms will usually go away within 12 hours.  Very rarely the procedure itself can cause mild seizures. 5. If needed, your surgeon will give you a prescription for pain medication.  It will take about 60 minutes for the oral pain medication to become fully effective.  So, it is recommended that you start taking this medication before the nerve block first begins to wear off, or when you first begin to feel discomfort. 6. Keep in mind that nerve blocks often wear off in the middle of the night.   If you are going to bed and the block has not started to wear off or you have not started to have any discomfort, consider setting an alarm for 2 to 3 hours, so you can assess your block.  If you notice the block is wearing off or you are starting to have discomfort, you can take your pain medication. 7. Take your pain medication only as prescribed.  Pain medication can cause sedation and decrease your breathing if you take more than you need for the level of pain that you have. 8. Nausea is a common side effect  of many pain medications.  You may want to eat something before taking your pain medicine to prevent nausea. 9. After an Interscalene nerve block, you cannot feel pain, pressure or extremes in temperature in the effected arm.  Because your arm is numb it is at an increased risk for injury.  To decrease the possibility of injury, please practice the following:  a. While you are awake change the position of your arm frequently to prevent too much pressure on any one area for prolonged periods of time. b.  If you have a cast or tight dressing, check the color or your fingers every couple of hours.  Call your surgeon with the appearance of any discoloration (white or blue). c. If you are given a sling to wear before you go home, please wear it  at all times until the block has completely worn off.  Do not get up at night without your sling. d. If you experience any problems or concerns, please contact your surgeon's office. e. If you experience severe or prolonged shortness of breath go to the nearest emergency department.  AMBULATORY SURGERY  DISCHARGE INSTRUCTIONS   1) The drugs that you were given will stay in your system until tomorrow so for the next 24 hours you should not:  A) Drive an automobile B) Make any legal decisions C) Drink any alcoholic beverage   2) You  may resume regular meals tomorrow.  Today it is better to start with liquids and gradually work up to solid foods.  You may eat anything you prefer, but it is better to start with liquids, then soup and crackers, and gradually work up to solid foods.   3) Please notify your doctor immediately if you have any unusual bleeding, trouble breathing, redness and pain at the surgery site, drainage, fever, or pain not relieved by medication.    4) Additional Instructions:   Please contact your physician with any problems or Same Day Surgery at [phone], Monday through Friday 6 am to 4 pm, or [facility]  number at [phone].

## 2018-01-18 NOTE — Progress Notes (Signed)
Inpatient Diabetes Program Recommendations  AACE/ADA: New Consensus Statement on Inpatient Glycemic Control (2015)  Target Ranges:  Prepandial:   less than 140 mg/dL      Peak postprandial:   less than 180 mg/dL (1-2 hours)      Critically ill patients:  140 - 180 mg/dL   Lab Results  Component Value Date   GLUCAP 282 (H) 01/18/2018   HGBA1C 9.3 (H) 01/17/2018    Inpatient Diabetes Program Recommendations:  Spoke to RN Eve regarding patient's high A1C of 9.3% (average blood sugar 220mg /dl).  Patient sees Dr. Gabriel Carina for her diabetes care- asked RN to stress to the patient,  the need for ideal blood sugars for success of heeling post surgery.  Dr. Gabriel Carina has asked the patient to check blood sugars 4x/day, before meals and at hs- RN will stress this to the patient.  Gentry Fitz, RN, BA, MHA, CDE Diabetes Coordinator Inpatient Diabetes Program  [phone] (Team Pager) [phone] ([place]) 01/18/2018 11:31 AM

## 2018-01-18 NOTE — H&P (Signed)
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 Symptoms are rated as moderate to severe, and have been worsening.  This is significantly impairing activities of daily living.  She has elected for surgical management.   Past Medical History:  Diagnosis Date  . A-fib ([facility])   . Coronary artery disease   . Depression   . Diabetes mellitus without complication ([facility])   . Environmental allergies   . Heart chamber dysfunction   . Hypertension   . PONV (postoperative nausea and vomiting)    Past Surgical History:  Procedure Laterality Date  . ABDOMINAL HYSTERECTOMY    . APPENDECTOMY    . CHOLECYSTECTOMY    . COLON SURGERY    . CORONARY ANGIOPLASTY WITH STENT PLACEMENT    . JOINT REPLACEMENT Left 2003   knee  . KNEE SURGERY    . NASAL SINUS SURGERY    . NECK SURGERY    . SMALL INTESTINE SURGERY     Social History   Socioeconomic History  . Marital status: Single    Spouse name: None  . Number of children: None  . Years of education: None  . Highest education level: None  Social Needs  . Financial resource strain: None  . Food insecurity - worry: None  . Food insecurity - inability: None  . Transportation needs - medical: None  . Transportation needs - non-medical: None  Occupational History  . None  Tobacco Use  . Smoking status: Current Every Day Smoker    Packs/day: 0.25  . Smokeless tobacco: Never Used  Substance and Sexual Activity  . Alcohol use: No  . Drug use: No  . Sexual activity: None  Other Topics Concern  . None  Social History Narrative  . None   Family History  Problem Relation Age of Onset  . Breast cancer Neg Hx    Allergies  Allergen Reactions  . Nsaids Hives    Can take otc ibuprofen   Prior to  Admission medications   Medication Sig Start Date End Date Taking? Authorizing Provider  acetaminophen (TYLENOL 8 HOUR ARTHRITIS PAIN) 650 MG CR tablet Take 1,950 mg by mouth 2 (two) times daily as needed for pain.   Yes [provider]  buPROPion (WELLBUTRIN XL) 150 MG 24 hr tablet Take 150 mg by mouth 2 (two) times daily.   Yes [provider]  Chlorpheniramine-APAP (CORICIDIN) 2-325 MG TABS Take 1-2 tablets by mouth 4 (four) times daily as needed (for congestion.).   Yes [provider]  diphenhydrAMINE (BENADRYL ALLERGY) 25 MG tablet Take 25 mg by mouth 2 (two) times daily as needed (for allergies/congestion.).   Yes [provider]  ezetimibe (ZETIA) 10 MG tablet Take 10 mg by mouth daily.   Yes [provider]  fluticasone (FLONASE) 50 MCG/ACT nasal spray Place 1-2 sprays into both nostrils daily as needed for allergies or rhinitis.   Yes [provider]  glyBURIDE (DIABETA) 5 MG tablet Take 10 mg by mouth 2 (two) times daily.   Yes [provider]  Insulin Detemir (LEVEMIR FLEXTOUCH) 100 UNIT/ML Pen Inject 60 Units into the skin 2 (two) times daily.    Yes [provider]  insulin lispro (HUMALOG KWIKPEN) 100 UNIT/ML KiwkPen Inject 20-40 Units into the skin 5 (five) times   daily. SLIDING SCALE INSULIN   Yes [provider]  linagliptin (TRADJENTA) 5 MG TABS tablet Take 5 mg by mouth daily.   Yes [provider]  lisinopril-hydrochlorothiazide (PRINZIDE,ZESTORETIC) 10-12.5 MG tablet Take 1 tablet by mouth daily.   Yes [provider]  metoprolol tartrate (LOPRESSOR) 25 MG tablet Take 0.625 mg by mouth daily.    Yes [provider]  rivaroxaban (XARELTO) 20 MG TABS tablet Take 20 mg by mouth daily.   Yes [provider]  ibuprofen (ADVIL,MOTRIN) 200 MG tablet Take 400 mg by mouth 2 (two) times daily as needed (FOR PAIN.).    [provider]     Positive ROS: All other  systems have been reviewed and were otherwise negative with the exception of those mentioned in the HPI and as above.  Physical Exam: General: Alert, no acute distress Cardiovascular: Regular rate and rhythm, no murmurs rubs or gallops.  No pedal edema Respiratory: Clear to auscultation bilaterally, no wheezes rales or rhonchi. No cyanosis, no use of accessory musculature GI: No organomegaly, abdomen is soft and non-tender nondistended with positive bowel sounds. Skin: Skin intact, no lesions within the operative field. Neurologic: Sensation intact distally Psychiatric: Patient is competent for consent with normal mood and affect Lymphatic: No axillary or cervical lymphadenopathy  MUSCULOSKELETAL: Left shoulder:  Pain with abduction and forward elevation.  Weakness of supraspinatus and external rotators.  NVI.  Full digital, wrist and ROM.  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  We discussed the details of surgery and the post-op course.   I discussed the risks and benefits of surgery. She understands the risks include but are not limited to infection, bleeding requiring blood transfusion, nerve or blood vessel injury, joint stiffness or loss of motion, persistent pain, weakness or instability, malunion, nonunion and hardware failure and the need for further surgery. Medical risks include but are not limited to DVT and pulmonary embolism, myocardial infarction, stroke, pneumonia, respiratory failure and death. Patient understood these risks and wished to proceed.   KRASINSKI, KEVIN, MD   01/18/2018 7:49 AM  

## 2018-01-18 NOTE — Anesthesia Preprocedure Evaluation (Signed)
Anesthesia Evaluation  Patient identified by MRN, date of birth, ID band Patient awake    Reviewed: Allergy & Precautions, H&P , NPO status , Patient's Chart, lab work & pertinent test results, reviewed documented beta blocker date and time   History of Anesthesia Complications (+) PONV and history of anesthetic complications  Airway Mallampati: III  TM Distance: <3 FB Neck ROM: full    Dental  (+) Teeth Intact   Pulmonary neg pulmonary ROS, Current Smoker,    Pulmonary exam normal        Cardiovascular Exercise Tolerance: Good hypertension, On Medications + CAD  negative cardio ROS Normal cardiovascular exam Rhythm:regular Rate:Normal     Neuro/Psych PSYCHIATRIC DISORDERS negative neurological ROS  negative psych ROS   GI/Hepatic negative GI ROS, Neg liver ROS,   Endo/Other  negative endocrine ROSdiabetes  Renal/GU negative Renal ROS  negative genitourinary   Musculoskeletal   Abdominal   Peds  Hematology negative hematology ROS (+)   Anesthesia Other Findings Past Medical History: No date: A-fib ([place]) No date: Coronary artery disease No date: Depression No date: Diabetes mellitus without complication (HCC) No date: Environmental allergies No date: Heart chamber dysfunction No date: Hypertension No date: PONV (postoperative nausea and vomiting) Past Surgical History: No date: ABDOMINAL HYSTERECTOMY No date: APPENDECTOMY No date: CHOLECYSTECTOMY No date: COLON SURGERY No date: CORONARY ANGIOPLASTY WITH STENT PLACEMENT 2003: JOINT REPLACEMENT; Left     Comment:  knee No date: KNEE SURGERY No date: NASAL SINUS SURGERY No date: NECK SURGERY No date: SMALL INTESTINE SURGERY BMI    Body Mass Index:  34.46 kg/m     Reproductive/Obstetrics negative OB ROS                             Anesthesia Physical Anesthesia Plan  ASA: III  Anesthesia Plan: General ETT   Post-op  Pain Management:  Regional for Post-op pain   Induction:   PONV Risk Score and Plan: 3  Airway Management Planned:   Additional Equipment:   Intra-op Plan:   Post-operative Plan:   Informed Consent: I have reviewed the patients History and Physical, chart, labs and discussed the procedure including the risks, benefits and alternatives for the proposed anesthesia with the patient or authorized representative who has indicated his/her understanding and acceptance.   Dental Advisory Given  Plan Discussed with: CRNA  Anesthesia Plan Comments: (ekg from [facility] in sept 2018 reveals multiple pvcs cw am rhythm strip.  Desires block and rb reviewed and accepted. JA)        Anesthesia Quick Evaluation

## 2018-01-18 NOTE — Op Note (Signed)
01/18/2018  10:41 AM  PATIENT:  Jenny Hensley  67 y.o. 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    The risks benefits and alternatives were discussed with the patient preoperatively including but not limited to the risks of infection, bleeding, nerve injury, persistent pain or weakness, failure of the hardware, re-tear of the rotator cuff and the need for further surgery. Medical risks include DVT and pulmonary embolism, myocardial infarction, stroke, pneumonia, respiratory failure and death. Patient understood these risks and wished to proceed.  OPERATIVE IMPLANTS: ArthroCare Magnum 2 anchors x 2 & Smith [employer]hew Q Fix anchors x 1  OPERATIVE PROCEDURE: The patient was met in the preoperative area. The left shoulder was signed with the word yes and my initials according the hospital's correct site of surgery protocol. The patient is brought to the OR and underwent general endotracheal intubation by the anesthesia service.  The patient was placed in a beachchair position. A spider arm positioner was used for this case. Examination under anesthesia revealed full passive ROM and no instability.  The patient was prepped and draped in a sterile fashion. A timeout was performed to verify the patient's name, date of birth, medical record  number, correct site of surgery and correct procedure to be performed there was also used to verify the patient received antibiotics that all appropriate instruments, implants and radiographs studies were available in the room. Once all in attendance were in agreement case began.  Bony landmarks were drawn out with a surgical marker along with proposed arthroscopy incisions. These were pre-injected with 1% lidocaine plain. An 11 blade was used to establish a posterior portal through which the arthroscope was placed in the glenohumeral joint. A full diagnostic examination of the shoulder was performed. The anterior portal was established under direct visualization with an 18-gauge spinal needle.  A 5.75 mm arthroscopic cannula was placed through the anterior portal.   The intra-articular portion of the biceps tendon was found to have a partial tear involving greater than 50% of the diameter. Therefore the decision was made to perform a tenotomy. An Arthrocare wand was used to release the biceps tendon off the superior labrum. The arthroscopic shaver was then used to debride the frayed edges of the labrum. There were no anterior or superior labral tears seen.  Subscapularis tendon was intact. Patient had a full-thickness tear involving the supraspinatus with retraction. There were no loose bodies within the inferior recess and no evidence of HAGL lesion.  The arthroscope was then placed in the subacromial space. A lateral portal was then established using an 18-gauge spinal needle for localization.   The greater tuberosity was debrided using a 5.5 mm resector shaver blade to remove all remaining foreign fibers of the rotator cuff.  Debridement was performed until punctate bleeding was seen at the greater tuberosity footprint, which will allow for rotator cuff healing.  Extensive bursitis was encountered and debrided using a   4-0 resector shaver blade and a 90 ArthroCare wand from the lateral portal. A  subacromial decompression was also performed using a 5.5 mm resector shaver blade from the lateral portal. The 5.5 mm resector shaver blade was then placed through the anterior portal and distal clavicle excision was performed. Two ArthroCare Perfect Pass sutures were placed in the lateral border of the rotator cuff tear. All arthroscopic instruments were then removed and the mini-open portion of the procedure began.   A saber-type incision was made along the lateral border of the acromion. The deltoid muscle was identified and split in line with its fibers which allowed visualization of the rotator cuff. The Perfect Pass sutures previously placed in the lateral border of the rotator cuff werealso brought out through the deltoid split. A single Q-Fix anchor was then placed at the articular margin of the humeral head and greater tuberosity. The four suture limbs of the Q Fix anchor was passed medially through the rotator cuff using a first pass suture passer. The Perfect Pass sutures from the lateral border of the rotator cuff were then anchored to thegreater tuberosity of the humeral head using two Magnum 2 anchors. These anchors were tensioned to allow for anatomic reduction of the rotator cuff to the greater tuberosity footprint. The medial row repair was then completed using an arthroscopic knot tying technique with the Q fix anchor sutures. Once all sutures were tied down, arthroscopic images of the double row repair were taken with the arthroscope both externally and arthroscopically fromthe glenohumeral joint  All incisions were copiously irrigated. The deltoid fascia was repaired using a 0 Vicryl suturean interrupted fashion. The subcutaneous tissue of all incisions were closed with a 2-0 Vicryl. Skin closure for the arthroscopic incisions was performed with 4-0 nylon. The skin edges of the saber incision were approximated with a running 4-0 undyed Monocryl. 0.25%  Marcaine plain was injected  into the subacromial space and at the injection sites.  A dry sterile dressing including Steri-Strips was applied . The patient was placed in an abduction sling, with a Polar Care sleeve.  All sharp and instrument counts were correct at the conclusion of the case. I was scrubbed and present for the entire case. I spoke with the patient's friend in the post-op consultation room and informed him that the case had been performed without complication and the patient was stable in recovery room.     Kevin L. Krasinski, MD 

## 2018-01-18 NOTE — Pre-Procedure Instructions (Signed)
NOTIFIED Arshan Jabs AT [facility] GLU ORDERED YESTERDAY WAS ENTERED AND RELEASED AFTER HGB AIC RELEASED.LAB TECH ONLY RECEIVED LABEL FOR HGB AIC SO DID NJOT KNOW TO ALSO DRAW GLU PRIOR TO END OF HIS SHIFT

## 2018-01-18 NOTE — Transfer of Care (Signed)
Immediate Anesthesia Transfer of Care Note  Patient: Jenny Hensley  Procedure(s) Performed: SHOULDER ARTHROSCOPY WITH OPEN ROTATOR CUFF REPAIR,SUBACROMINAL DECOMPRESSION, DISTAL CLAVICLE EXCISION,BICEPS TENOTOMY (Left Shoulder)  Patient Location: PACU  Anesthesia Type:General  Level of Consciousness: sedated  Airway & Oxygen Therapy: Patient Spontanous Breathing and Patient connected to face mask oxygen  Post-op Assessment: Report given to RN and Post -op Vital signs reviewed and stable  Post vital signs: Reviewed and stable  Last Vitals:  Vitals:   01/18/18 0742 01/18/18 0747  BP: 133/84 139/73  Pulse: (!) 105 (!) 54  Resp: (!) 22 (!) 24  Temp:    SpO2: 96% 99%    Last Pain:  Vitals:   01/18/18 0747  TempSrc:   PainSc: Asleep         Complications: No apparent anesthesia complications

## 2018-01-18 NOTE — Anesthesia Postprocedure Evaluation (Signed)
Anesthesia Post Note  Patient: Jenny Hensley  Procedure(s) Performed: SHOULDER ARTHROSCOPY WITH OPEN ROTATOR CUFF REPAIR,SUBACROMINAL DECOMPRESSION, DISTAL CLAVICLE EXCISION,BICEPS TENOTOMY (Left Shoulder)  Patient location during evaluation: PACU Anesthesia Type: General Level of consciousness: awake and alert Pain management: pain level controlled Vital Signs Assessment: post-procedure vital signs reviewed and stable Respiratory status: spontaneous breathing, nonlabored ventilation, respiratory function stable and patient connected to nasal cannula oxygen Cardiovascular status: blood pressure returned to baseline and stable Postop Assessment: no apparent nausea or vomiting Anesthetic complications: no     Last Vitals:  Vitals:   01/18/18 1145 01/18/18 1149  BP: 125/79   Pulse: (!) 48 (!) 50  Resp: (!) 21 19  Temp:    SpO2: 93% 95%    Last Pain:  Vitals:   01/18/18 1134  TempSrc:   PainSc: 0-No pain                 Molli Barrows

## 2018-01-19 DIAGNOSIS — M25512 Pain in left shoulder: Secondary | ICD-10-CM | POA: Insufficient documentation

## 2018-04-05 ENCOUNTER — Telehealth: Payer: Self-pay | Admitting: Gastroenterology

## 2018-04-05 NOTE — Telephone Encounter (Signed)
Patient LVM to schedule a colonoscopy. Please call

## 2018-04-09 ENCOUNTER — Telehealth: Payer: Self-pay

## 2018-04-09 NOTE — Telephone Encounter (Signed)
Please contact patient for her colonoscopy. Appreciate it. Sharyn Lull

## 2018-04-09 NOTE — Telephone Encounter (Signed)
Gastroenterology Pre-Procedure Review  Request Date: 04/23/2018 Requesting Physician: Dr. Vicente Males    PATIENT REVIEW QUESTIONS: The patient responded to the following health history questions as indicated:    1. Are you having any GI issues? No  2. Do you have a personal history of Polyps? Yes  3. Do you have a family history of Colon Cancer or Polyps? Yes, sister Colon Ca, abnormal polyps 3 siblings   4. Diabetes Mellitus? Yes  5. Joint replacements in the past 12 months? No  6. Major health problems in the past 3 months? No  7. Any artificial heart valves, MVP, or defibrillator? 3 Stents placed    MEDICATIONS & ALLERGIES:    Patient reports the following regarding taking any anticoagulation/antiplatelet therapy:   Plavix, Coumadin, Eliquis, Xarelto, Lovenox, Pradaxa, Brilinta, or Effient?  Yes, Xarelto Aspirin? No   Patient confirms/reports the following medications:  Current Outpatient Medications  Medication Sig Dispense Refill  . acetaminophen (TYLENOL 8 HOUR ARTHRITIS PAIN) 650 MG CR tablet Take 1,950 mg by mouth 2 (two) times daily as needed for pain.    Marland Kitchen buPROPion (WELLBUTRIN XL) 150 MG 24 hr tablet Take 150 mg by mouth 2 (two) times daily.    . Chlorpheniramine-APAP (CORICIDIN) 2-325 MG TABS Take 1-2 tablets by mouth 4 (four) times daily as needed (for congestion.).    Marland Kitchen diphenhydrAMINE (BENADRYL ALLERGY) 25 MG tablet Take 25 mg by mouth 2 (two) times daily as needed (for allergies/congestion.).    Marland Kitchen ezetimibe (ZETIA) 10 MG tablet Take 10 mg by mouth daily.    . fluticasone (FLONASE) 50 MCG/ACT nasal spray Place 1-2 sprays into both nostrils daily as needed for allergies or rhinitis.    Marland Kitchen glyBURIDE (DIABETA) 5 MG tablet Take 10 mg by mouth 2 (two) times daily.    Marland Kitchen ibuprofen (ADVIL,MOTRIN) 200 MG tablet Take 400 mg by mouth 2 (two) times daily as needed (FOR PAIN.).    . Insulin Detemir (LEVEMIR FLEXTOUCH) 100 UNIT/ML Pen Inject 60 Units into the skin 2 (two) times daily.     .  insulin lispro (HUMALOG KWIKPEN) 100 UNIT/ML KiwkPen Inject 20-40 Units into the skin 5 (five) times daily. SLIDING SCALE INSULIN    . linagliptin (TRADJENTA) 5 MG TABS tablet Take 5 mg by mouth daily.    Marland Kitchen lisinopril-hydrochlorothiazide (PRINZIDE,ZESTORETIC) 10-12.5 MG tablet Take 1 tablet by mouth daily.    . metoprolol tartrate (LOPRESSOR) 25 MG tablet Take 0.625 mg by mouth daily.     . ondansetron (ZOFRAN) 4 MG tablet Take 1 tablet (4 mg total) by mouth every 8 (eight) hours as needed for nausea or vomiting. 30 tablet 0  . oxyCODONE (OXY IR/ROXICODONE) 5 MG immediate release tablet Take 1 tablet (5 mg total) by mouth every 4 (four) hours as needed for severe pain. 40 tablet 0  . rivaroxaban (XARELTO) 20 MG TABS tablet Take 20 mg by mouth daily.     No current facility-administered medications for this visit.     Patient confirms/reports the following allergies:  Allergies  Allergen Reactions  . Nsaids Hives    Can take otc ibuprofen    No orders of the defined types were placed in this encounter.   AUTHORIZATION INFORMATION Primary Insurance: 1D#: Group #:  Secondary Insurance: 1D#: Group #:  SCHEDULE INFORMATION: Date: 04/23/2018 Time: Location: [facility]

## 2018-04-12 ENCOUNTER — Other Ambulatory Visit: Payer: Self-pay

## 2018-04-12 ENCOUNTER — Encounter: Payer: Self-pay | Admitting: Gastroenterology

## 2018-04-12 DIAGNOSIS — Z1211 Encounter for screening for malignant neoplasm of colon: Secondary | ICD-10-CM

## 2018-04-23 ENCOUNTER — Encounter: Payer: Self-pay | Admitting: Anesthesiology

## 2018-04-23 ENCOUNTER — Other Ambulatory Visit: Payer: Self-pay

## 2018-04-23 ENCOUNTER — Ambulatory Visit
Admission: RE | Admit: 2018-04-23 | Discharge: 2018-04-23 | Disposition: A | Discharge status: Home / Self Care | Payer: Medicare Other | Source: Ambulatory Visit | Attending: Gastroenterology | Admitting: Gastroenterology

## 2018-04-23 ENCOUNTER — Encounter
Admission: RE | Disposition: A | Discharge status: Home / Self Care | Payer: Self-pay | Source: Ambulatory Visit | Attending: Gastroenterology

## 2018-04-23 ENCOUNTER — Encounter: Payer: Self-pay | Admitting: *Deleted

## 2018-04-23 DIAGNOSIS — Z539 Procedure and treatment not carried out, unspecified reason: Secondary | ICD-10-CM | POA: Diagnosis not present

## 2018-04-23 SURGERY — COLONOSCOPY WITH PROPOFOL
Anesthesia: General

## 2018-04-24 ENCOUNTER — Encounter
Admission: RE | Disposition: A | Discharge status: Home / Self Care | Payer: Self-pay | Source: Ambulatory Visit | Attending: Gastroenterology

## 2018-04-24 ENCOUNTER — Ambulatory Visit: Payer: Medicare Other | Admitting: Anesthesiology

## 2018-04-24 ENCOUNTER — Ambulatory Visit
Admission: RE | Admit: 2018-04-24 | Discharge: 2018-04-24 | Disposition: A | Discharge status: Home / Self Care | Payer: Medicare Other | Source: Ambulatory Visit | Attending: Gastroenterology | Admitting: Gastroenterology

## 2018-04-24 ENCOUNTER — Encounter: Payer: Self-pay | Admitting: Anesthesiology

## 2018-04-24 DIAGNOSIS — D122 Benign neoplasm of ascending colon: Secondary | ICD-10-CM | POA: Insufficient documentation

## 2018-04-24 DIAGNOSIS — Z1211 Encounter for screening for malignant neoplasm of colon: Secondary | ICD-10-CM | POA: Diagnosis not present

## 2018-04-24 DIAGNOSIS — R011 Cardiac murmur, unspecified: Secondary | ICD-10-CM | POA: Insufficient documentation

## 2018-04-24 DIAGNOSIS — Z86010 Personal history of colonic polyps: Secondary | ICD-10-CM | POA: Diagnosis not present

## 2018-04-24 DIAGNOSIS — Z886 Allergy status to analgesic agent status: Secondary | ICD-10-CM | POA: Insufficient documentation

## 2018-04-24 DIAGNOSIS — F17210 Nicotine dependence, cigarettes, uncomplicated: Secondary | ICD-10-CM | POA: Insufficient documentation

## 2018-04-24 DIAGNOSIS — Z79899 Other long term (current) drug therapy: Secondary | ICD-10-CM | POA: Diagnosis not present

## 2018-04-24 DIAGNOSIS — Z794 Long term (current) use of insulin: Secondary | ICD-10-CM | POA: Diagnosis not present

## 2018-04-24 DIAGNOSIS — Z96652 Presence of left artificial knee joint: Secondary | ICD-10-CM | POA: Diagnosis not present

## 2018-04-24 DIAGNOSIS — I10 Essential (primary) hypertension: Secondary | ICD-10-CM | POA: Insufficient documentation

## 2018-04-24 DIAGNOSIS — E119 Type 2 diabetes mellitus without complications: Secondary | ICD-10-CM | POA: Diagnosis not present

## 2018-04-24 DIAGNOSIS — Z955 Presence of coronary angioplasty implant and graft: Secondary | ICD-10-CM | POA: Diagnosis not present

## 2018-04-24 DIAGNOSIS — F329 Major depressive disorder, single episode, unspecified: Secondary | ICD-10-CM | POA: Diagnosis not present

## 2018-04-24 DIAGNOSIS — I2510 Atherosclerotic heart disease of native coronary artery without angina pectoris: Secondary | ICD-10-CM | POA: Diagnosis not present

## 2018-04-24 DIAGNOSIS — I4891 Unspecified atrial fibrillation: Secondary | ICD-10-CM | POA: Diagnosis not present

## 2018-04-24 DIAGNOSIS — Z7901 Long term (current) use of anticoagulants: Secondary | ICD-10-CM | POA: Diagnosis not present

## 2018-04-24 HISTORY — PX: COLONOSCOPY WITH PROPOFOL: SHX5780

## 2018-04-24 LAB — GLUCOSE, CAPILLARY: GLUCOSE-CAPILLARY: 203 mg/dL — AB (ref 65–99)

## 2018-04-24 SURGERY — COLONOSCOPY WITH PROPOFOL
Anesthesia: General

## 2018-04-24 MED ORDER — ESMOLOL HCL 100 MG/10ML IV SOLN
INTRAVENOUS | Status: DC | PRN
  Administered 2018-04-24 (×3): 20 mg via INTRAVENOUS

## 2018-04-24 MED ORDER — PROPOFOL 10 MG/ML IV BOLUS
INTRAVENOUS | Status: AC
  Filled 2018-04-24: qty 20

## 2018-04-24 MED ORDER — PROPOFOL 500 MG/50ML IV EMUL
INTRAVENOUS | Status: DC | PRN
  Administered 2018-04-24: 150 ug/kg/min via INTRAVENOUS

## 2018-04-24 MED ORDER — PROPOFOL 10 MG/ML IV BOLUS
INTRAVENOUS | Status: DC | PRN
  Administered 2018-04-24: 60 mg via INTRAVENOUS

## 2018-04-24 MED ORDER — SODIUM CHLORIDE 0.9 % IV SOLN
INTRAVENOUS | Status: DC
  Administered 2018-04-24: 1000 mL via INTRAVENOUS

## 2018-04-24 MED ORDER — ESMOLOL HCL 100 MG/10ML IV SOLN
INTRAVENOUS | Status: AC
  Filled 2018-04-24: qty 10

## 2018-04-24 MED ORDER — PROPOFOL 500 MG/50ML IV EMUL
INTRAVENOUS | Status: AC
  Filled 2018-04-24: qty 50

## 2018-04-24 NOTE — Anesthesia Preprocedure Evaluation (Signed)
Anesthesia Evaluation  Patient identified by MRN, date of birth, ID band Patient awake    Reviewed: Allergy & Precautions, H&P , NPO status , Patient's Chart, lab work & pertinent test results, reviewed documented beta blocker date and time   History of Anesthesia Complications (+) PONV and history of anesthetic complications  Airway Mallampati: III  TM Distance: <3 FB Neck ROM: full    Dental  (+) Teeth Intact, Dental Advidsory Given, Missing   Pulmonary neg shortness of breath, neg COPD, neg recent URI, Current Smoker,           Cardiovascular Exercise Tolerance: Good hypertension, On Medications (-) angina+ CAD and + Cardiac Stents  (-) Past MI and (-) CABG + dysrhythmias Atrial Fibrillation (-) Valvular Problems/Murmurs     Neuro/Psych PSYCHIATRIC DISORDERS Depression negative neurological ROS     GI/Hepatic negative GI ROS, Neg liver ROS,   Endo/Other  diabetes, Insulin Dependent, Oral Hypoglycemic Agents  Renal/GU negative Renal ROS  negative genitourinary   Musculoskeletal   Abdominal   Peds  Hematology negative hematology ROS (+)   Anesthesia Other Findings Past Medical History: No date: A-fib (HCC) No date: Coronary artery disease No date: Depression No date: Diabetes mellitus without complication (HCC) No date: Environmental allergies No date: Heart chamber dysfunction No date: Hypertension No date: PONV (postoperative nausea and vomiting) Past Surgical History: No date: ABDOMINAL HYSTERECTOMY No date: APPENDECTOMY No date: CHOLECYSTECTOMY No date: COLON SURGERY No date: CORONARY ANGIOPLASTY WITH STENT PLACEMENT 2003: JOINT REPLACEMENT; Left     Comment:  knee No date: KNEE SURGERY No date: NASAL SINUS SURGERY No date: NECK SURGERY No date: SMALL INTESTINE SURGERY BMI    Body Mass Index:  34.46 kg/m     Reproductive/Obstetrics negative OB ROS                              Anesthesia Physical  Anesthesia Plan  ASA: III  Anesthesia Plan: General   Post-op Pain Management:  Regional for Post-op pain   Induction: Intravenous  PONV Risk Score and Plan: 3 and Propofol infusion  Airway Management Planned: Nasal Cannula  Additional Equipment:   Intra-op Plan:   Post-operative Plan:   Informed Consent: I have reviewed the patients History and Physical, chart, labs and discussed the procedure including the risks, benefits and alternatives for the proposed anesthesia with the patient or authorized representative who has indicated his/her understanding and acceptance.   Dental Advisory Given  Plan Discussed with: CRNA  Anesthesia Plan Comments:         Anesthesia Quick Evaluation

## 2018-04-24 NOTE — Anesthesia Procedure Notes (Signed)
Performed by: Feliza Diven, CRNA Pre-anesthesia Checklist: Patient identified, Emergency Drugs available, Suction available, Patient being monitored and Timeout performed Patient Re-evaluated:Patient Re-evaluated prior to induction Oxygen Delivery Method: Nasal cannula Induction Type: IV induction       

## 2018-04-24 NOTE — Op Note (Signed)
[facility]astroenterology Patient Name: Jenny Hensley Procedure Date: 04/24/2018 8:54 AM MRN: [id number] Account #: [id number] Date of Birth: 06-Sep-1951 Admit Type: Outpatient Age: 67 Room: [facility] ENDO ROOM 1 Gender: Female Note Status: Finalized Procedure:            Colonoscopy Indications:          High risk colon cancer surveillance: Personal history                        of colonic polyps Providers:            Jonathon Bellows MD, MD Referring MD:         Marguerita Merles, MD (Referring MD) Medicines:            Monitored Anesthesia Care Complications:        No immediate complications. Procedure:            Pre-Anesthesia Assessment:                       - Prior to the procedure, a History and Physical was                        performed, and patient medications, allergies and                        sensitivities were reviewed. The patient's tolerance of                        previous anesthesia was reviewed.                       - The risks and benefits of the procedure and the                        sedation options and risks were discussed with the                        patient. All questions were answered and informed                        consent was obtained.                       - ASA Grade Assessment: III - A patient with severe                        systemic disease.                       After obtaining informed consent, the colonoscope was                        passed under direct vision. Throughout the procedure,                        the patient's blood pressure, pulse, and oxygen                        saturations were monitored continuously. The  Colonoscope was introduced through the anus and                        advanced to the the cecum, identified by the                        appendiceal orifice, IC valve and transillumination.                        The colonoscopy was technically difficult and complex                    due to the patient's body habitus and excess movement                        due to abdominal breathing. [Solution]. The quality of                        the bowel preparation was adequate. Findings:      The perianal and digital rectal examinations were normal.      A 20 mm polyp was found in the proximal ascending colon. The polyp was       sessile. Preparations were made for mucosal resection. 6 mL of Eleview       was injected with adequate lift of the lesion from the muscularis       propria. Snare mucosal resection with suction (via the working channel)       retrieval was performed. A 22 mm area was resected. Resection and       retrieval were complete. There was no bleeding during, and at the end,       of the procedure. To prevent bleeding after mucosal resection, two       hemostatic clips were successfully placed. There was no bleeding during,       or at the end, of the procedure.      The exam was otherwise without abnormality on direct and retroflexion       views. Impression:           - One 20 mm polyp in the proximal ascending colon,                        removed with mucosal resection. Resected and retrieved.                        Clips were placed.                       - The examination was otherwise normal on direct and                        retroflexion views.                       - Mucosal resection was performed. Resection and                        retrieval were complete. Recommendation:       - Discharge patient to home (with escort).                       - Resume previous diet.                       -  Continue present medications.                       - Await pathology results.                       - Repeat colonoscopy in 6 months for surveillance.                       - She likely suffers from sleep apnea and would                        recommend her to be evaluated for the same. Procedure Code(s):    --- Professional ---                        [phone], Colonoscopy, flexible; with endoscopic mucosal                        resection Diagnosis Code(s):    --- Professional ---                       Z86.010, Personal history of colonic polyps                       D12.2, Benign neoplasm of ascending colon CPT copyright 2017 [employer]. All rights reserved. The codes documented in this report are preliminary and upon coder review may  be revised to meet current compliance requirements. Jonathon Bellows, MD Jonathon Bellows MD, MD 04/24/2018 9:46:00 AM This report has been signed electronically. Number of Addenda: 0 Note Initiated On: 04/24/2018 8:54 AM Scope Withdrawal Time: 0 hours 36 minutes 12 seconds  Total Procedure Duration: 0 hours 41 minutes 6 seconds       [facility]

## 2018-04-24 NOTE — H&P (Signed)
Jonathon Bellows, MD [address], [place], [place], [place], [address] [address], [place], [place], [place], [address] Phone: [phone]  Fax: [phone]  Primary Care Physician:  Marguerita Merles, MD   Pre-Procedure History & Physical: HPI:  Jenny Hensley is a 67 y.o. female is here for an colonoscopy.   Past Medical History:  Diagnosis Date  . A-fib ([place])   . Coronary artery disease   . Depression   . Diabetes mellitus without complication ([place])   . Environmental allergies   . Heart chamber dysfunction   . Hypertension   . PONV (postoperative nausea and vomiting)     Past Surgical History:  Procedure Laterality Date  . ABDOMINAL HYSTERECTOMY    . APPENDECTOMY    . CHOLECYSTECTOMY    . COLON SURGERY    . CORONARY ANGIOPLASTY WITH STENT PLACEMENT    . JOINT REPLACEMENT Left 2003   knee  . KNEE SURGERY    . NASAL SINUS SURGERY    . NECK SURGERY    . SHOULDER ARTHROSCOPY WITH OPEN ROTATOR CUFF REPAIR Left 01/18/2018   Procedure: SHOULDER ARTHROSCOPY WITH OPEN ROTATOR CUFF REPAIR,SUBACROMINAL DECOMPRESSION, DISTAL CLAVICLE EXCISION,BICEPS TENOTOMY;  Surgeon: Thornton Park, MD;  Location: [facility] ORS;  Service: Orthopedics;  Laterality: Left;  . SMALL INTESTINE SURGERY      Prior to Admission medications   Medication Sig Start Date End Date Taking? Authorizing Provider  acetaminophen (TYLENOL 8 HOUR ARTHRITIS PAIN) 650 MG CR tablet Take 1,950 mg by mouth 2 (two) times daily as needed for pain.   Yes [provider]  buPROPion (WELLBUTRIN XL) 150 MG 24 hr tablet Take 150 mg by mouth 2 (two) times daily.   Yes [provider]  Chlorpheniramine-APAP (CORICIDIN) 2-325 MG TABS Take 1-2 tablets by mouth 4 (four) times daily as needed (for congestion.).   Yes [provider]  diphenhydrAMINE (BENADRYL ALLERGY) 25 MG tablet Take 25 mg by mouth 2 (two) times daily as needed (for allergies/congestion.).   Yes [provider]  glyBURIDE  (DIABETA) 5 MG tablet Take 10 mg by mouth 2 (two) times daily.   Yes [provider]  ibuprofen (ADVIL,MOTRIN) 200 MG tablet Take 400 mg by mouth 2 (two) times daily as needed (FOR PAIN.).   Yes [provider]  Insulin Detemir (LEVEMIR FLEXTOUCH) 100 UNIT/ML Pen Inject 60 Units into the skin 2 (two) times daily.    Yes [provider]  insulin lispro (HUMALOG KWIKPEN) 100 UNIT/ML KiwkPen Inject 20-40 Units into the skin 5 (five) times daily. SLIDING SCALE INSULIN   Yes [provider]  linagliptin (TRADJENTA) 5 MG TABS tablet Take 5 mg by mouth daily.   Yes [provider]  lisinopril-hydrochlorothiazide (PRINZIDE,ZESTORETIC) 10-12.5 MG tablet Take 1 tablet by mouth daily.   Yes [provider]  metoprolol tartrate (LOPRESSOR) 25 MG tablet Take 0.625 mg by mouth daily.    Yes [provider]  ondansetron (ZOFRAN) 4 MG tablet Take 1 tablet (4 mg total) by mouth every 8 (eight) hours as needed for nausea or vomiting. 01/18/18  Yes Thornton Park, MD  oxyCODONE (OXY IR/ROXICODONE) 5 MG immediate release tablet Take 1 tablet (5 mg total) by mouth every 4 (four) hours as needed for severe pain. 01/18/18  Yes Thornton Park, MD  rivaroxaban (XARELTO) 20 MG TABS tablet Take 20 mg by mouth daily.   Yes [provider]  ezetimibe (ZETIA) 10 MG tablet Take 10 mg by mouth daily.  [provider]  fluticasone (FLONASE) 50 MCG/ACT nasal spray Place 1-2 sprays into both nostrils daily as needed for allergies or rhinitis.    [provider]    Allergies as of 04/23/2018 - Review Complete 04/23/2018  Allergen Reaction Noted  . Nsaids Hives 05/06/2017    Family History  Problem Relation Age of Onset  . Breast cancer Neg Hx     Social History   Socioeconomic History  . Marital status: Single    Spouse name: Not on file  . Number of children: Not on file  . Years of education: Not on file  . Highest education  level: Not on file  Occupational History  . Not on file  Social Needs  . Financial resource strain: Not on file  . Food insecurity:    Worry: Not on file    Inability: Not on file  . Transportation needs:    Medical: Not on file    Non-medical: Not on file  Tobacco Use  . Smoking status: Current Every Day Smoker    Packs/day: 0.25  . Smokeless tobacco: Never Used  Substance and Sexual Activity  . Alcohol use: No  . Drug use: No  . Sexual activity: Not on file  Lifestyle  . Physical activity:    Days per week: Not on file    Minutes per session: Not on file  . Stress: Not on file  Relationships  . Social connections:    Talks on phone: Not on file    Gets together: Not on file    Attends religious service: Not on file    Active member of club or organization: Not on file    Attends meetings of clubs or organizations: Not on file    Relationship status: Not on file  . Intimate partner violence:    Fear of current or ex partner: Not on file    Emotionally abused: Not on file    Physically abused: Not on file    Forced sexual activity: Not on file  Other Topics Concern  . Not on file  Social History Narrative  . Not on file    Review of Systems: See HPI, otherwise negative ROS  Physical Exam: BP (!) 135/100   Pulse (!) 108   Temp (!) 97.5 F (36.4 C) (Tympanic)   Resp (!) 22   Ht 5\' 7"  (1.702 m)   Wt 220 lb (99.8 kg)   SpO2 98%   BMI 34.46 kg/m  General:   Alert,  pleasant and cooperative in NAD Head:  Normocephalic and atraumatic. Neck:  Supple; no masses or thyromegaly. Lungs:  Clear throughout to auscultation, normal respiratory effort.    Heart:  +S1, +S2, Regular rate and rhythm, No edema. Abdomen:  Soft, nontender and nondistended. Normal bowel sounds, without guarding, and without rebound.   Neurologic:  Alert and  oriented x4;  grossly normal neurologically.  Impression/Plan: Jenny Hensley is here for an colonoscopy to be performed for  surveillance due to prior history of colon polyps   Risks, benefits, limitations, and alternatives regarding  colonoscopy have been reviewed with the patient.  Questions have been answered.  All parties agreeable.   Jonathon Bellows, MD  04/24/2018, 8:47 AM

## 2018-04-24 NOTE — Anesthesia Postprocedure Evaluation (Signed)
Anesthesia Post Note  Patient: Jenny Hensley  Procedure(s) Performed: COLONOSCOPY WITH PROPOFOL (N/A )  Patient location during evaluation: Endoscopy Anesthesia Type: General Level of consciousness: awake and alert Pain management: pain level controlled Vital Signs Assessment: post-procedure vital signs reviewed and stable Respiratory status: spontaneous breathing, nonlabored ventilation, respiratory function stable and patient connected to nasal cannula oxygen Cardiovascular status: blood pressure returned to baseline and stable Postop Assessment: no apparent nausea or vomiting Anesthetic complications: no     Last Vitals:  Vitals:   04/24/18 1010 04/24/18 1020  BP: (!) 139/118 (!) 135/95  Pulse: (!) 108 (!) 114  Resp: 18 18  Temp:    SpO2: 99% 99%    Last Pain:  Vitals:   04/24/18 0734  TempSrc: Tympanic  PainSc: 1                  Martha Clan

## 2018-04-24 NOTE — Anesthesia Post-op Follow-up Note (Signed)
Anesthesia QCDR form completed.        

## 2018-04-24 NOTE — Transfer of Care (Signed)
Immediate Anesthesia Transfer of Care Note  Patient: Jenny Hensley  Procedure(s) Performed: COLONOSCOPY WITH PROPOFOL (N/A )  Patient Location: PACU  Anesthesia Type:General  Level of Consciousness: awake, alert  and oriented  Airway & Oxygen Therapy: Patient Spontanous Breathing and Patient connected to nasal cannula oxygen  Post-op Assessment: Report given to RN and Post -op Vital signs reviewed and stable  Post vital signs: Reviewed and stable  Last Vitals:  Vitals Value Taken Time  BP    Temp    Pulse    Resp    SpO2      Last Pain:  Vitals:   04/24/18 0734  TempSrc: Tympanic  PainSc: 1          Complications: No apparent anesthesia complications

## 2018-04-25 ENCOUNTER — Encounter: Payer: Self-pay | Admitting: Gastroenterology

## 2018-04-25 LAB — SURGICAL PATHOLOGY

## 2018-04-30 ENCOUNTER — Telehealth: Payer: Self-pay

## 2018-04-30 NOTE — Telephone Encounter (Signed)
Advised patient of colonoscopy results per Dr. Vicente Males.    - inform the polyp was pre cancerous and since taken out piece meal; repeat colonoscopy in 6 months   Patient requested not to schedule at this time. Sending reminder letter.

## 2018-05-04 ENCOUNTER — Other Ambulatory Visit: Payer: Self-pay

## 2018-05-04 DIAGNOSIS — I4891 Unspecified atrial fibrillation: Secondary | ICD-10-CM | POA: Insufficient documentation

## 2018-05-04 DIAGNOSIS — K219 Gastro-esophageal reflux disease without esophagitis: Secondary | ICD-10-CM | POA: Insufficient documentation

## 2018-05-04 DIAGNOSIS — I10 Essential (primary) hypertension: Secondary | ICD-10-CM | POA: Insufficient documentation

## 2018-06-08 ENCOUNTER — Ambulatory Visit: Payer: Medicare Other | Attending: Otolaryngology

## 2018-06-08 DIAGNOSIS — R0683 Snoring: Secondary | ICD-10-CM | POA: Diagnosis not present

## 2018-06-08 DIAGNOSIS — Z6833 Body mass index (BMI) 33.0-33.9, adult: Secondary | ICD-10-CM | POA: Diagnosis not present

## 2018-06-08 DIAGNOSIS — E119 Type 2 diabetes mellitus without complications: Secondary | ICD-10-CM | POA: Insufficient documentation

## 2018-06-08 DIAGNOSIS — I119 Hypertensive heart disease without heart failure: Secondary | ICD-10-CM | POA: Diagnosis not present

## 2018-06-08 DIAGNOSIS — F5101 Primary insomnia: Secondary | ICD-10-CM | POA: Insufficient documentation

## 2018-06-08 DIAGNOSIS — F329 Major depressive disorder, single episode, unspecified: Secondary | ICD-10-CM | POA: Diagnosis not present

## 2018-06-08 DIAGNOSIS — G4733 Obstructive sleep apnea (adult) (pediatric): Secondary | ICD-10-CM | POA: Diagnosis present

## 2018-06-08 DIAGNOSIS — E669 Obesity, unspecified: Secondary | ICD-10-CM | POA: Insufficient documentation

## 2018-06-27 ENCOUNTER — Ambulatory Visit: Payer: Medicare Other | Attending: Otolaryngology

## 2018-06-27 DIAGNOSIS — G4733 Obstructive sleep apnea (adult) (pediatric): Secondary | ICD-10-CM | POA: Diagnosis not present

## 2018-06-28 ENCOUNTER — Other Ambulatory Visit: Payer: Self-pay | Admitting: Family Medicine

## 2018-06-28 DIAGNOSIS — Z1231 Encounter for screening mammogram for malignant neoplasm of breast: Secondary | ICD-10-CM

## 2018-07-19 ENCOUNTER — Ambulatory Visit
Admission: RE | Admit: 2018-07-19 | Discharge: 2018-07-19 | Disposition: A | Discharge status: Home / Self Care | Payer: Medicare Other | Source: Ambulatory Visit | Attending: Family Medicine | Admitting: Family Medicine

## 2018-07-19 DIAGNOSIS — Z1231 Encounter for screening mammogram for malignant neoplasm of breast: Secondary | ICD-10-CM | POA: Insufficient documentation

## 2018-09-20 ENCOUNTER — Ambulatory Visit
Admission: RE | Admit: 2018-09-20 | Discharge: 2018-09-20 | Disposition: A | Discharge status: Home / Self Care | Payer: Medicare Other | Source: Ambulatory Visit | Attending: Otolaryngology | Admitting: Otolaryngology

## 2018-09-20 ENCOUNTER — Other Ambulatory Visit: Payer: Self-pay | Admitting: Otolaryngology

## 2018-09-20 DIAGNOSIS — J310 Chronic rhinitis: Secondary | ICD-10-CM | POA: Diagnosis present

## 2018-11-26 ENCOUNTER — Telehealth: Payer: Self-pay | Admitting: Gastroenterology

## 2018-11-26 NOTE — Telephone Encounter (Signed)
Pt stopped by to see if Dr. Vicente Males could try to perform a colon test on her again.  Per Dr. Vicente Males.Marland KitchenMarland KitchenPt  Needs clearance from pulmonary dr. In order to perform repeat colon test. Patient suffers from sleep apnea & stopped breathing during her last colon procedure.  Pls call patient when dr. Vicente Males refers her to pulmonary dr.

## 2018-11-26 NOTE — Telephone Encounter (Signed)
Message sent already

## 2018-11-28 ENCOUNTER — Other Ambulatory Visit: Payer: Self-pay

## 2018-11-28 DIAGNOSIS — Z1211 Encounter for screening for malignant neoplasm of colon: Secondary | ICD-10-CM

## 2018-11-28 DIAGNOSIS — G4730 Sleep apnea, unspecified: Secondary | ICD-10-CM

## 2018-11-28 NOTE — Telephone Encounter (Signed)
Called pt to inform her that we have referred her to pulmonology.  Unable to contact. LVM to return call

## 2018-11-29 ENCOUNTER — Telehealth: Payer: Self-pay | Admitting: Gastroenterology

## 2018-11-29 NOTE — Telephone Encounter (Signed)
Pt left vm she missed 2 calls from [place]

## 2018-11-30 NOTE — Telephone Encounter (Signed)
Spoke with pt and informed her that we've sent her referral to [facility]. Pt is aware that [facility] will contact her to schedule.

## 2018-11-30 NOTE — Telephone Encounter (Signed)
Pt is calling to speak with Dr. Vicente Males about a lung referral so she can schedule a colonoscopy please call pt

## 2018-12-20 NOTE — Telephone Encounter (Signed)
Called [facility]) to check the status of pt referral. I have been unable to speak with anyone in the Pulmonary department.

## 2018-12-24 ENCOUNTER — Ambulatory Visit: Payer: Medicare Other | Admitting: Pulmonary Disease

## 2018-12-24 ENCOUNTER — Encounter: Payer: Self-pay | Admitting: Pulmonary Disease

## 2018-12-24 VITALS — BP 142/86 | HR 79 | Ht 67.0 in | Wt 216.0 lb

## 2018-12-24 DIAGNOSIS — D126 Benign neoplasm of colon, unspecified: Secondary | ICD-10-CM

## 2018-12-24 DIAGNOSIS — G4733 Obstructive sleep apnea (adult) (pediatric): Secondary | ICD-10-CM | POA: Diagnosis not present

## 2018-12-24 DIAGNOSIS — F17200 Nicotine dependence, unspecified, uncomplicated: Secondary | ICD-10-CM | POA: Diagnosis not present

## 2018-12-24 DIAGNOSIS — J309 Allergic rhinitis, unspecified: Secondary | ICD-10-CM | POA: Diagnosis not present

## 2018-12-24 DIAGNOSIS — Z01811 Encounter for preprocedural respiratory examination: Secondary | ICD-10-CM

## 2018-12-24 MED ORDER — CETIRIZINE HCL 10 MG PO TABS: 10.0000 mg | ORAL_TABLET | Freq: Every day | ORAL | 10 refills | Status: AC

## 2018-12-24 NOTE — Progress Notes (Signed)
PULMONARY CONSULT NOTE  Requesting MD/Service: Vicente Males Date of initial consultation: 12/24/18 Reason for consultation: OSA, pre-procedure evaluation  PT PROFILE: 68 y.o. female smoker (light smoker all of her life, off and on, presently approx 3 cigs/day) referred by Dr. Vicente Males for preprocedure pulmonary evaluation.  Patient has a history of OSA and a previous colonoscopy was aborted in the midst of the procedure due to apnea and hypoxemia  DATA: 06/12/18 PSG: Mild to moderate OSA with a total AHI of 6.4/h, worst during REM sleep, mostly involving hypopneas.  Sleep efficiency was poor and most arousals were spontaneous. 06/29/18 CPAP titration: recommended CPAP 11 cm H2O  INTERVAL:  HPI:  As above.  Patient has a history of what sounds like familial polyposis.  She has had innumerable colonoscopies over her adult lifetime.  She was undergoing a "routine" colonoscopy in May 2019 during which time she suffered hypoxemia and apnea requiring abortion of the procedure and discontinuation of sedation (propofol).  On the basis of this event, she was referred to ENT (Dr. Ayesha Mohair) for evaluation of [place] sleep apnea.  She underwent sleep study and CPAP titration study as documented above.  She is trying to wear her CPAP compliantly.  However she has problems with chronic nasal congestion and rhinitis.  She is recently been started on immunotherapy for allergies.  She has been prescribed Flonase nasal inhaler which she does not use consistently.    She is a smoker of approximately 3 cigarettes/day.  She is never been told that she has any underlying lung problems.  She denies significant exertional dyspnea.  She has no significant cough or sputum production other than cough attributed to posterior nasal drainage.  She has never had hemoptysis or pleuritic chest pain.  It is now time for her to undergo another colonoscopy and Dr. Renato Gails has referred her to me for preprocedure evaluation and  recommendations.  Past Medical History:  Diagnosis Date  . A-fib ([place])   . Coronary artery disease   . Depression   . Diabetes mellitus without complication ([place])   . Environmental allergies   . Heart chamber dysfunction   . Hypertension   . PONV (postoperative nausea and vomiting)     Past Surgical History:  Procedure Laterality Date  . ABDOMINAL HYSTERECTOMY    . APPENDECTOMY    . CHOLECYSTECTOMY    . COLON SURGERY    . COLONOSCOPY WITH PROPOFOL N/A 04/24/2018   Procedure: COLONOSCOPY WITH PROPOFOL;  Surgeon: Jonathon Bellows, MD;  Location: [facility] ENDOSCOPY;  Service: Gastroenterology;  Laterality: N/A;  . CORONARY ANGIOPLASTY WITH STENT PLACEMENT    . JOINT REPLACEMENT Left 2003   knee  . KNEE SURGERY    . NASAL SINUS SURGERY    . NECK SURGERY    . SHOULDER ARTHROSCOPY WITH OPEN ROTATOR CUFF REPAIR Left 01/18/2018   Procedure: SHOULDER ARTHROSCOPY WITH OPEN ROTATOR CUFF REPAIR,SUBACROMINAL DECOMPRESSION, DISTAL CLAVICLE EXCISION,BICEPS TENOTOMY;  Surgeon: Thornton Park, MD;  Location: [facility] ORS;  Service: Orthopedics;  Laterality: Left;  . SMALL INTESTINE SURGERY      MEDICATIONS: I have reviewed all medications and confirmed regimen as documented  Social History   Socioeconomic History  . Marital status: Single    Spouse name: Not on file  . Number of children: Not on file  . Years of education: Not on file  . Highest education level: Not on file  Occupational History  . Not on file  Social Needs  . Financial resource strain: Not on file  .  Food insecurity:    Worry: Not on file    Inability: Not on file  . Transportation needs:    Medical: Not on file    Non-medical: Not on file  Tobacco Use  . Smoking status: Current Every Day Smoker    Packs/day: 0.25    Types: Cigarettes  . Smokeless tobacco: Never Used  . Tobacco comment: 3 cigarettes per day   Substance and Sexual Activity  . Alcohol use: No  . Drug use: No  . Sexual activity: Not on file  Lifestyle   . Physical activity:    Days per week: Not on file    Minutes per session: Not on file  . Stress: Not on file  Relationships  . Social connections:    Talks on phone: Not on file    Gets together: Not on file    Attends religious service: Not on file    Active member of club or organization: Not on file    Attends meetings of clubs or organizations: Not on file    Relationship status: Not on file  . Intimate partner violence:    Fear of current or ex partner: Not on file    Emotionally abused: Not on file    Physically abused: Not on file    Forced sexual activity: Not on file  Other Topics Concern  . Not on file  Social History Narrative  . Not on file    Family History  Problem Relation Age of Onset  . Breast cancer Neg Hx     ROS: No fever, myalgias/arthralgias, unexplained weight loss or weight gain No new focal weakness or sensory deficits No otalgia, hearing loss, visual changes, nasal and sinus symptoms, mouth and throat problems No neck pain or adenopathy No abdominal pain, N/V/D, diarrhea, change in bowel pattern No dysuria, change in urinary pattern   Vitals:   12/24/18 1031  BP: (!) 142/86  Pulse: 79  SpO2: 97%  Weight: 216 lb (98 kg)  Height: 5\' 7"  (1.702 m)     EXAM:   Gen: NAD, moderately obese HEENT: NCAT, sclera white, bilateral rhinitis Neck: No JVD Lungs: breath sounds full, no wheezes or other adventitious sounds Cardiovascular: RRR, no murmurs Abdomen: Soft, nontender, normal BS Ext: without clubbing, cyanosis, edema Neuro: grossly intact Skin: Limited exam, no lesions noted   DATA:   BMP Latest Ref Rng & Units 01/15/2018  Glucose 65 - 99 mg/dL 381(H)  BUN 6 - 20 mg/dL 32(H)  Creatinine 0.44 - 1.00 mg/dL 0.84  Sodium 135 - 145 mmol/L 134(L)  Potassium 3.5 - 5.1 mmol/L 4.3  Chloride 101 - 111 mmol/L 101  CO2 22 - 32 mmol/L 27  Calcium 8.9 - 10.3 mg/dL 9.1    CBC Latest Ref Rng & Units 01/15/2018  WBC 3.6 - 11.0 K/uL 8.6   Hemoglobin 12.0 - 16.0 g/dL 14.5  Hematocrit 35.0 - 47.0 % 43.9  Platelets 150 - 440 K/uL 207    CXR: No recent film available  I have personally reviewed all chest radiographs reported above including CXRs and CT chest unless otherwise indicated  IMPRESSION:     ICD-10-CM   1. Familial polyposis D12.6   2. Obstructive sleep apnea G47.33   3. Chronic allergic rhinitis J30.9   4. Smoker F17.200   5. Preop pulmonary/respiratory exam Z01.811    Her sleep apnea was rather mild based on polysomnogram and was particularly severe in supine position.  Therefore, I do not think this alone  should require intubation for colonoscopy.  I would think that the colonoscopy could be managed with cautious sedation titration.  I would recommend sedation with a fast acting (fast on, fast off) agent such as propofol.  I would certainly avoid all opiates of any kind around the time of the procedure.Marland Kitchen  PLAN:  To summarize, my recommendation is proceed cautiously with colonoscopy using propofol at the lowest infusion rate possible with careful monitoring of respiratory status and with BiPAP on standby.  Anesthesiology should be made aware of previous problems and adjust their strategy of sedation accordingly.  I strongly encourage avoidance of all opiates peri-procedure.  If the above is not a satisfactory recommendation for the problem, she will need to undergo colonoscopy while intubated.  This too can be arranged with anesthesia perhaps.  Dr. Vicente Males and anesthesiology: Please feel free to contact me on my mobile number listed below regarding any of the above recommendations and we can discuss further.  For chronic rhinitis, I recommended that she use Flonase inhaler on a consistent basis, 2 sprays per nostril daily and I have changed Benadryl to cetirizine 10 mg daily  Follow-up in this office as needed   Merton Border, MD PCCM service Mobile [phone] Pager [phone] 12/24/2018 1:27 PM

## 2018-12-24 NOTE — Patient Instructions (Signed)
I have recommended discontinuation of all cigarette smoking Change Benadryl to cetirizine (Zyrtec) 10 mg daily.  I have placed this prescription I will discuss with Dr. Vicente Males and anesthesiology regarding options of sedation during next colonoscopy Further management of obstructive sleep apnea and allergies per Dr. Ayesha Mohair Follow-up in this office as needed

## 2019-01-02 ENCOUNTER — Telehealth: Payer: Self-pay | Admitting: Gastroenterology

## 2019-01-02 ENCOUNTER — Other Ambulatory Visit: Payer: Self-pay

## 2019-01-02 DIAGNOSIS — Z86010 Personal history of colonic polyps: Secondary | ICD-10-CM

## 2019-01-02 NOTE — Telephone Encounter (Signed)
Pt is calling to schedule a colonoscopy  °

## 2019-01-02 NOTE — Telephone Encounter (Signed)
Called pt to inform her that were are currently waiting for directions from pt cardiologist, Dr. Clayborn Bigness, to give pt cardiac clearance and blood thinner holding instructions for the Xarelto. Once we have received the clearance we can proceed with scheduling pt procedure.  Unable to contact pt LVM to return call

## 2019-01-03 MED ORDER — NA SULFATE-K SULFATE-MG SULF 17.5-3.13-1.6 GM/177ML PO SOLN: 1.0000 | Freq: Once | ORAL | 0 refills | Status: AC

## 2019-01-03 NOTE — Telephone Encounter (Signed)
Spoke with pt and informed her that we are waiting for her cardiac clearance and blood thinner holding instructions from Dr. Clayborn Bigness. We have scheduled pt colonoscopy. I explained to pt that we will contact her once we have received the clearance from Dr. Clayborn Bigness.

## 2019-01-10 NOTE — Telephone Encounter (Signed)
Spoke with pt and informed her that we have received her blood thinner holding instructions for the Xarelto. Pt is to stop Xarelto 5 day prior to colonoscopy procedure and resume 3 days after. Pt understands.

## 2019-01-12 ENCOUNTER — Inpatient Hospital Stay
Admission: EM | Admit: 2019-01-12 | Discharge: 2019-01-16 | DRG: 871 | Disposition: A | Discharge status: Home Health | Payer: Medicare Other | Attending: Specialist | Admitting: Specialist

## 2019-01-12 ENCOUNTER — Other Ambulatory Visit: Payer: Self-pay

## 2019-01-12 ENCOUNTER — Emergency Department: Payer: Medicare Other

## 2019-01-12 ENCOUNTER — Inpatient Hospital Stay: Payer: Medicare Other

## 2019-01-12 ENCOUNTER — Encounter: Payer: Self-pay | Admitting: Emergency Medicine

## 2019-01-12 DIAGNOSIS — R6520 Severe sepsis without septic shock: Secondary | ICD-10-CM

## 2019-01-12 DIAGNOSIS — Z90710 Acquired absence of both cervix and uterus: Secondary | ICD-10-CM | POA: Diagnosis not present

## 2019-01-12 DIAGNOSIS — Z794 Long term (current) use of insulin: Secondary | ICD-10-CM | POA: Diagnosis not present

## 2019-01-12 DIAGNOSIS — N189 Chronic kidney disease, unspecified: Secondary | ICD-10-CM | POA: Diagnosis present

## 2019-01-12 DIAGNOSIS — F329 Major depressive disorder, single episode, unspecified: Secondary | ICD-10-CM | POA: Diagnosis present

## 2019-01-12 DIAGNOSIS — E1122 Type 2 diabetes mellitus with diabetic chronic kidney disease: Secondary | ICD-10-CM | POA: Diagnosis present

## 2019-01-12 DIAGNOSIS — I129 Hypertensive chronic kidney disease with stage 1 through stage 4 chronic kidney disease, or unspecified chronic kidney disease: Secondary | ICD-10-CM | POA: Diagnosis present

## 2019-01-12 DIAGNOSIS — Z9049 Acquired absence of other specified parts of digestive tract: Secondary | ICD-10-CM | POA: Diagnosis not present

## 2019-01-12 DIAGNOSIS — F17210 Nicotine dependence, cigarettes, uncomplicated: Secondary | ICD-10-CM | POA: Diagnosis present

## 2019-01-12 DIAGNOSIS — N3090 Cystitis, unspecified without hematuria: Secondary | ICD-10-CM

## 2019-01-12 DIAGNOSIS — I482 Chronic atrial fibrillation, unspecified: Secondary | ICD-10-CM | POA: Diagnosis present

## 2019-01-12 DIAGNOSIS — J9601 Acute respiratory failure with hypoxia: Secondary | ICD-10-CM

## 2019-01-12 DIAGNOSIS — Z96652 Presence of left artificial knee joint: Secondary | ICD-10-CM | POA: Diagnosis present

## 2019-01-12 DIAGNOSIS — R7881 Bacteremia: Secondary | ICD-10-CM | POA: Diagnosis not present

## 2019-01-12 DIAGNOSIS — F419 Anxiety disorder, unspecified: Secondary | ICD-10-CM | POA: Diagnosis present

## 2019-01-12 DIAGNOSIS — R000 Tachycardia, unspecified: Secondary | ICD-10-CM | POA: Diagnosis present

## 2019-01-12 DIAGNOSIS — R140 Abdominal distension (gaseous): Secondary | ICD-10-CM

## 2019-01-12 DIAGNOSIS — N179 Acute kidney failure, unspecified: Secondary | ICD-10-CM

## 2019-01-12 DIAGNOSIS — Z6833 Body mass index (BMI) 33.0-33.9, adult: Secondary | ICD-10-CM

## 2019-01-12 DIAGNOSIS — E871 Hypo-osmolality and hyponatremia: Secondary | ICD-10-CM

## 2019-01-12 DIAGNOSIS — A4151 Sepsis due to Escherichia coli [E. coli]: Secondary | ICD-10-CM | POA: Diagnosis not present

## 2019-01-12 DIAGNOSIS — Z7951 Long term (current) use of inhaled steroids: Secondary | ICD-10-CM | POA: Diagnosis not present

## 2019-01-12 DIAGNOSIS — Z79891 Long term (current) use of opiate analgesic: Secondary | ICD-10-CM | POA: Diagnosis not present

## 2019-01-12 DIAGNOSIS — Z7901 Long term (current) use of anticoagulants: Secondary | ICD-10-CM

## 2019-01-12 DIAGNOSIS — I480 Paroxysmal atrial fibrillation: Secondary | ICD-10-CM | POA: Diagnosis not present

## 2019-01-12 DIAGNOSIS — A419 Sepsis, unspecified organism: Secondary | ICD-10-CM

## 2019-01-12 DIAGNOSIS — J9690 Respiratory failure, unspecified, unspecified whether with hypoxia or hypercapnia: Secondary | ICD-10-CM

## 2019-01-12 DIAGNOSIS — Z955 Presence of coronary angioplasty implant and graft: Secondary | ICD-10-CM

## 2019-01-12 DIAGNOSIS — I4891 Unspecified atrial fibrillation: Secondary | ICD-10-CM | POA: Diagnosis not present

## 2019-01-12 DIAGNOSIS — I2510 Atherosclerotic heart disease of native coronary artery without angina pectoris: Secondary | ICD-10-CM | POA: Diagnosis present

## 2019-01-12 DIAGNOSIS — R0600 Dyspnea, unspecified: Secondary | ICD-10-CM | POA: Diagnosis not present

## 2019-01-12 DIAGNOSIS — K5900 Constipation, unspecified: Secondary | ICD-10-CM | POA: Diagnosis not present

## 2019-01-12 DIAGNOSIS — K219 Gastro-esophageal reflux disease without esophagitis: Secondary | ICD-10-CM | POA: Diagnosis present

## 2019-01-12 DIAGNOSIS — E1169 Type 2 diabetes mellitus with other specified complication: Secondary | ICD-10-CM

## 2019-01-12 DIAGNOSIS — Z886 Allergy status to analgesic agent status: Secondary | ICD-10-CM

## 2019-01-12 LAB — URINALYSIS, COMPLETE (UACMP) WITH MICROSCOPIC
Bilirubin Urine: NEGATIVE
Ketones, ur: NEGATIVE mg/dL
NITRITE: NEGATIVE
PROTEIN: 100 mg/dL — AB
Specific Gravity, Urine: 1.012 (ref 1.005–1.030)
pH: 5 (ref 5.0–8.0)

## 2019-01-12 LAB — CBC
HCT: 37.1 % (ref 36.0–46.0)
HEMOGLOBIN: 12.6 g/dL (ref 12.0–15.0)
MCH: 31 pg (ref 26.0–34.0)
MCHC: 34 g/dL (ref 30.0–36.0)
MCV: 91.2 fL (ref 80.0–100.0)
Platelets: 182 10*3/uL (ref 150–400)
RBC: 4.07 MIL/uL (ref 3.87–5.11)
RDW: 13 % (ref 11.5–15.5)
WBC: 26.7 10*3/uL — ABNORMAL HIGH (ref 4.0–10.5)
nRBC: 0 % (ref 0.0–0.2)

## 2019-01-12 LAB — BASIC METABOLIC PANEL
Anion gap: 11 (ref 5–15)
BUN: 42 mg/dL — ABNORMAL HIGH (ref 8–23)
CO2: 21 mmol/L — ABNORMAL LOW (ref 22–32)
Calcium: 8.2 mg/dL — ABNORMAL LOW (ref 8.9–10.3)
Chloride: 95 mmol/L — ABNORMAL LOW (ref 98–111)
Creatinine, Ser: 1.6 mg/dL — ABNORMAL HIGH (ref 0.44–1.00)
GFR calc Af Amer: 38 mL/min — ABNORMAL LOW (ref >60)
GFR calc non Af Amer: 33 mL/min — ABNORMAL LOW (ref >60)
GLUCOSE: 331 mg/dL — AB (ref 70–99)
POTASSIUM: 3.8 mmol/L (ref 3.5–5.1)
Sodium: 127 mmol/L — ABNORMAL LOW (ref 135–145)

## 2019-01-12 LAB — LACTIC ACID, PLASMA
Lactic Acid, Venous: 3.2 mmol/L (ref 0.5–1.9)
Lactic Acid, Venous: 2.4 mmol/L (ref 0.5–1.9)

## 2019-01-12 LAB — MAGNESIUM: Magnesium: 2 mg/dL (ref 1.7–2.4)

## 2019-01-12 LAB — MRSA PCR SCREENING: MRSA by PCR: NEGATIVE

## 2019-01-12 LAB — PROCALCITONIN: Procalcitonin: 5.36 ng/mL

## 2019-01-12 LAB — GLUCOSE, CAPILLARY
Glucose-Capillary: 182 mg/dL — ABNORMAL HIGH (ref 70–99)
Glucose-Capillary: 146 mg/dL — ABNORMAL HIGH (ref 70–99)

## 2019-01-12 LAB — TROPONIN I: Troponin I: 0.03 ng/mL (ref <0.03)

## 2019-01-12 LAB — PHOSPHORUS: Phosphorus: 1.4 mg/dL — ABNORMAL LOW (ref 2.5–4.6)

## 2019-01-12 MED ORDER — K PHOS MONO-SOD PHOS DI & MONO 155-852-130 MG PO TABS
500.0000 mg | ORAL_TABLET | Freq: Once | ORAL | Status: AC
  Administered 2019-01-12: 500 mg via ORAL
  Filled 2019-01-12 (×2): qty 2

## 2019-01-12 MED ORDER — METOPROLOL TARTRATE 5 MG/5ML IV SOLN
5.0000 mg | INTRAVENOUS | Status: DC | PRN
  Administered 2019-01-13 – 2019-01-14 (×2): 5 mg via INTRAVENOUS
  Filled 2019-01-12 (×3): qty 5

## 2019-01-12 MED ORDER — METRONIDAZOLE IN NACL 5-0.79 MG/ML-% IV SOLN: 500.0000 mg | Freq: Three times a day (TID) | INTRAVENOUS | Status: DC

## 2019-01-12 MED ORDER — SODIUM CHLORIDE 0.9 % IV BOLUS (SEPSIS)
1000.0000 mL | Freq: Once | INTRAVENOUS | Status: AC
  Administered 2019-01-12: 1000 mL via INTRAVENOUS

## 2019-01-12 MED ORDER — ENOXAPARIN SODIUM 100 MG/ML ~~LOC~~ SOLN: 1.0000 mg/kg | Freq: Two times a day (BID) | SUBCUTANEOUS | Status: DC

## 2019-01-12 MED ORDER — ENOXAPARIN SODIUM 100 MG/ML ~~LOC~~ SOLN
1.0000 mg/kg | Freq: Two times a day (BID) | SUBCUTANEOUS | Status: DC
  Administered 2019-01-12 – 2019-01-14 (×4): 100 mg via SUBCUTANEOUS
  Filled 2019-01-12 (×5): qty 1

## 2019-01-12 MED ORDER — ONDANSETRON HCL 4 MG/2ML IJ SOLN: 4.0000 mg | Freq: Four times a day (QID) | INTRAMUSCULAR | Status: DC | PRN

## 2019-01-12 MED ORDER — MAGNESIUM HYDROXIDE 400 MG/5ML PO SUSP
30.0000 mL | Freq: Once | ORAL | Status: AC
  Administered 2019-01-12: 30 mL via ORAL
  Filled 2019-01-12: qty 30

## 2019-01-12 MED ORDER — ONDANSETRON HCL 4 MG PO TABS: 4.0000 mg | ORAL_TABLET | Freq: Four times a day (QID) | ORAL | Status: DC | PRN

## 2019-01-12 MED ORDER — SODIUM CHLORIDE 0.9 % IV BOLUS
1000.0000 mL | Freq: Once | INTRAVENOUS | Status: AC
  Administered 2019-01-12: 1000 mL via INTRAVENOUS

## 2019-01-12 MED ORDER — SODIUM CHLORIDE 0.9 % IV SOLN: 2.0000 g | Freq: Once | INTRAVENOUS | Status: DC

## 2019-01-12 MED ORDER — ACETAMINOPHEN 650 MG RE SUPP: 650.0000 mg | Freq: Four times a day (QID) | RECTAL | Status: DC | PRN

## 2019-01-12 MED ORDER — SODIUM CHLORIDE 0.9 % IV SOLN
2.0000 g | INTRAVENOUS | Status: DC
  Administered 2019-01-12: 2 g via INTRAVENOUS
  Filled 2019-01-12: qty 20

## 2019-01-12 MED ORDER — VANCOMYCIN HCL IN DEXTROSE 1-5 GM/200ML-% IV SOLN: 1000.0000 mg | Freq: Once | INTRAVENOUS | Status: DC

## 2019-01-12 MED ORDER — ORAL CARE MOUTH RINSE
15.0000 mL | Freq: Two times a day (BID) | OROMUCOSAL | Status: DC
  Administered 2019-01-12: 15 mL via OROMUCOSAL

## 2019-01-12 MED ORDER — POLYETHYLENE GLYCOL 3350 17 G PO PACK
17.0000 g | PACK | Freq: Every day | ORAL | Status: DC | PRN
  Administered 2019-01-12: 17 g via ORAL
  Filled 2019-01-12: qty 1

## 2019-01-12 MED ORDER — SODIUM CHLORIDE 0.9 % IV SOLN: INTRAVENOUS | Status: DC | PRN

## 2019-01-12 MED ORDER — METOPROLOL TARTRATE 5 MG/5ML IV SOLN
5.0000 mg | Freq: Once | INTRAVENOUS | Status: AC
  Administered 2019-01-12: 5 mg via INTRAVENOUS
  Filled 2019-01-12: qty 5

## 2019-01-12 MED ORDER — METOPROLOL TARTRATE 25 MG PO TABS
25.0000 mg | ORAL_TABLET | Freq: Once | ORAL | Status: AC
  Administered 2019-01-12: 25 mg via ORAL
  Filled 2019-01-12: qty 1

## 2019-01-12 MED ORDER — INSULIN ASPART 100 UNIT/ML ~~LOC~~ SOLN
0.0000 [IU] | Freq: Three times a day (TID) | SUBCUTANEOUS | Status: DC
  Administered 2019-01-13: 7 [IU] via SUBCUTANEOUS
  Administered 2019-01-13: 2 [IU] via SUBCUTANEOUS
  Administered 2019-01-13 – 2019-01-14 (×2): 9 [IU] via SUBCUTANEOUS
  Administered 2019-01-14: 7 [IU] via SUBCUTANEOUS
  Administered 2019-01-14: 5 [IU] via SUBCUTANEOUS
  Administered 2019-01-15 – 2019-01-16 (×3): 1 [IU] via SUBCUTANEOUS
  Filled 2019-01-12 (×9): qty 1

## 2019-01-12 MED ORDER — ACETAMINOPHEN 325 MG PO TABS
650.0000 mg | ORAL_TABLET | Freq: Four times a day (QID) | ORAL | Status: DC | PRN
  Administered 2019-01-12 – 2019-01-14 (×3): 650 mg via ORAL
  Filled 2019-01-12 (×3): qty 2

## 2019-01-12 MED ORDER — SODIUM CHLORIDE 0.9 % IV SOLN
INTRAVENOUS | Status: DC
  Administered 2019-01-12 – 2019-01-13 (×2): via INTRAVENOUS

## 2019-01-12 NOTE — ED Notes (Signed)
Blood cultures x2 collected. Pt tolerated well.

## 2019-01-12 NOTE — ED Provider Notes (Signed)
[facility] Emergency Department Provider Note  ____________________________________________  Time seen: Approximately 4:25 PM  I have reviewed the triage vital signs and the nursing notes.   HISTORY  Chief Complaint Dysuria and Weakness    HPI Jenny Hensley is a 67 y.o. female with a history of atrial fibrillation, CAD diabetes hypertension who sent to the ED today from urgent care due to urinary tract infection and concern for sepsis.  Patient reports dysuria and urinary frequency for the past week.  Denies fever or chills.  Denies chest pain or shortness of breath but report from urgent care physician is that patient had hypoxia to 88% there.  On 2 L nasal cannula her oxygenation is normalized here.      Past Medical History:  Diagnosis Date  . A-fib ([place])   . Coronary artery disease   . Depression   . Diabetes mellitus without complication ([place])   . Environmental allergies   . Heart chamber dysfunction   . Hypertension   . PONV (postoperative nausea and vomiting)      Patient Active Problem List   Diagnosis Date Noted  . Atrial fibrillation ([place]) 05/04/2018  . GERD (gastroesophageal reflux disease) 05/04/2018  . Hypertension 05/04/2018  . Pain of left shoulder joint on movement 01/19/2018  . Diabetes mellitus type 2, uncomplicated ([place]) 16/09/9603  . Uncontrolled type 2 diabetes mellitus with hyperglycemia, with long-term current use of insulin ([place]) 07/23/2017  . 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 ABDOMINAL HYSTERECTOMY    . APPENDECTOMY    . CHOLECYSTECTOMY    . COLON SURGERY    . COLONOSCOPY WITH PROPOFOL N/A 04/24/2018   Procedure: COLONOSCOPY WITH PROPOFOL;  Surgeon: Jonathon Bellows, MD;  Location: [facility] ENDOSCOPY;  Service: Gastroenterology;  Laterality: N/A;  . CORONARY ANGIOPLASTY WITH STENT PLACEMENT    . JOINT REPLACEMENT Left 2003   knee  . KNEE SURGERY    . NASAL SINUS SURGERY     . NECK SURGERY    . SHOULDER ARTHROSCOPY WITH OPEN ROTATOR CUFF REPAIR Left 01/18/2018   Procedure: SHOULDER ARTHROSCOPY WITH OPEN ROTATOR CUFF REPAIR,SUBACROMINAL DECOMPRESSION, DISTAL CLAVICLE EXCISION,BICEPS TENOTOMY;  Surgeon: Thornton Park, MD;  Location: [facility] ORS;  Service: Orthopedics;  Laterality: Left;  . SMALL INTESTINE SURGERY       Prior to Admission medications   Medication Sig Start Date End Date Taking? Authorizing Provider  acetaminophen (TYLENOL 8 HOUR ARTHRITIS PAIN) 650 MG CR tablet Take 1,950 mg by mouth 2 (two) times daily as needed for pain.    [provider]  buPROPion (WELLBUTRIN XL) 150 MG 24 hr tablet Take 150 mg by mouth daily.     [provider]  cetirizine (ZYRTEC ALLERGY) 10 MG tablet Take 1 tablet (10 mg total) by mouth daily. 12/24/18   Wilhelmina Mcardle, MD  diphenhydrAMINE (BENADRYL ALLERGY) 25 MG tablet Take 25 mg by mouth 2 (two) times daily as needed (for allergies/congestion.).    [provider]  Dulaglutide (TRULICITY) 5.40 JW/1.1BJ SOPN Trulicity 4.78 GN/5.6 mL subcutaneous pen injector    [provider]  ezetimibe (ZETIA) 10 MG tablet Take 10 mg by mouth daily.    [provider]  fluticasone (FLONASE) 50 MCG/ACT nasal spray Place 1-2 sprays into both nostrils daily as needed for allergies or rhinitis.    [provider]  glyBURIDE (DIABETA) 5 MG tablet Take 10 mg by mouth 2 (two) times daily.  [provider]  ibuprofen (ADVIL,MOTRIN) 200 MG tablet Take 400 mg by mouth 2 (two) times daily as needed (FOR PAIN.).    [provider]  Insulin Degludec (TRESIBA FLEXTOUCH) 200 UNIT/ML SOPN Tresiba FlexTouch U-200 insulin 200 unit/mL (3 mL) subcutaneous pen    [provider]  Insulin Detemir (LEVEMIR FLEXTOUCH) 100 UNIT/ML Pen Inject 64 Units into the skin 2 (two) times daily.     [provider]  insulin lispro (HUMALOG KWIKPEN) 100 UNIT/ML KiwkPen Inject 20-40  Units into the skin 5 (five) times daily. SLIDING SCALE INSULIN    [provider]  ketoconazole (NIZORAL) 2 % cream ketoconazole 2 % topical cream    [provider]  linagliptin (TRADJENTA) 5 MG TABS tablet Take 5 mg by mouth daily.    [provider]  lisinopril-hydrochlorothiazide (PRINZIDE,ZESTORETIC) 10-12.5 MG tablet Take 1 tablet by mouth daily.    [provider]  metoprolol tartrate (LOPRESSOR) 25 MG tablet Take 0.625 mg by mouth daily.     [provider]  mometasone (ELOCON) 0.1 % lotion mometasone 0.1 % topical solution    [provider]  oxyCODONE (OXY IR/ROXICODONE) 5 MG immediate release tablet Take 1 tablet (5 mg total) by mouth every 4 (four) hours as needed for severe pain. 01/18/18   Thornton Park, MD  oxyCODONE (OXY IR/ROXICODONE) 5 MG immediate release tablet oxycodone 5 mg tablet    [provider]  rivaroxaban (XARELTO) 20 MG TABS tablet Take 20 mg by mouth daily.    [provider]  Zoster Vaccine Live, PF, (ZOSTAVAX) 13086 UNT/0.65ML injection Zostavax (PF) 19,400 unit/0.65 mL subcutaneous suspension    [provider]     Allergies Nsaids   Family History  Problem Relation Age of Onset  . Breast cancer Neg Hx     Social History Social History   Tobacco Use  . Smoking status: Current Every Day Smoker    Packs/day: 0.25    Types: Cigarettes  . Smokeless tobacco: Never Used  . Tobacco comment: 3 cigarettes per day   Substance Use Topics  . Alcohol use: No  . Drug use: No    Review of Systems  Constitutional:   No fever or chills.  ENT:   No sore throat. No rhinorrhea. Cardiovascular:   No chest pain or syncope. Respiratory:   No dyspnea or cough. Gastrointestinal:   Negative for abdominal pain, vomiting and diarrhea.  Musculoskeletal:   Negative for focal pain or swelling All other systems reviewed and are negative except as documented above in ROS and  HPI.  ____________________________________________   PHYSICAL EXAM:  VITAL SIGNS: ED Triage Vitals  Enc Vitals Group     BP 01/12/19 1437 (!) 143/101     Pulse Rate 01/12/19 1443 (!) 137     Resp 01/12/19 1443 (!) 32     Temp 01/12/19 1443 99.2 F (37.3 C)     Temp Source 01/12/19 1443 Oral     SpO2 01/12/19 1441 95 %     Weight 01/12/19 1443 216 lb (98 kg)     Height 01/12/19 1443 5\' 7"  (1.702 m)     Head Circumference --      Peak Flow --      Pain Score 01/12/19 1443 0     Pain Loc --      Pain Edu? --      Excl. in [place]? --     Vital signs reviewed, nursing assessments reviewed.   Constitutional:  Alert and oriented.  Ill-appearing. Eyes:   Conjunctivae are normal. EOMI. PERRL. ENT      Head:   Normocephalic and atraumatic.      Nose:   No congestion/rhinnorhea.       Mouth/Throat:   Dry mucous membranes, no pharyngeal erythema. No peritonsillar mass.       Neck:   No meningismus. Full ROM. Hematological/Lymphatic/Immunilogical:   No cervical lymphadenopathy. Cardiovascular:   Irregular rhythm, rate 140-160.Marland Kitchen Symmetric bilateral radial and DP pulses.  No murmurs. Cap refill less than 2 seconds. Respiratory:   Normal respiratory effort without tachypnea/retractions. Breath sounds are clear and equal bilaterally. No wheezes/rales/rhonchi. Gastrointestinal:   Soft with suprapubic tenderness. Non distended.   No rebound, rigidity, or guarding. Musculoskeletal:   Normal range of motion in all extremities. No joint effusions.  No lower extremity tenderness.  No edema. Neurologic:   Normal speech and language.  Motor grossly intact. No acute focal neurologic deficits are appreciated.  Skin:    Skin is warm, dry and intact. No rash noted.  No petechiae, purpura, or bullae.  ____________________________________________    LABS (pertinent positives/negatives) (all labs ordered are listed, but only abnormal results are displayed) Labs Reviewed  BASIC METABOLIC PANEL -  Abnormal; Notable for the following components:      Result Value   Sodium 127 (*)    Chloride 95 (*)    CO2 21 (*)    Glucose, Bld 331 (*)    BUN 42 (*)    Creatinine, Ser 1.60 (*)    Calcium 8.2 (*)    GFR calc non Af Amer 33 (*)    GFR calc Af Amer 38 (*)    All other components within normal limits  CBC - Abnormal; Notable for the following components:   WBC 26.7 (*)    All other components within normal limits  URINALYSIS, COMPLETE (UACMP) WITH MICROSCOPIC - Abnormal; Notable for the following components:   Color, Urine AMBER (*)    APPearance CLOUDY (*)    Glucose, UA >=500 (*)    Hgb urine dipstick MODERATE (*)    Protein, ur 100 (*)    Leukocytes, UA MODERATE (*)    WBC, UA >50 (*)    Bacteria, UA FEW (*)    All other components within normal limits  LACTIC ACID, PLASMA - Abnormal; Notable for the following components:   Lactic Acid, Venous 3.2 (*)    All other components within normal limits  CULTURE, BLOOD (ROUTINE X 2)  CULTURE, BLOOD (ROUTINE X 2)  URINE CULTURE  LACTIC ACID, PLASMA   ____________________________________________   EKG  Interpreted by me Atrial fibrillation, tachycardia rate 143, normal axis and intervals.  Normal QRS ST segments and T waves.  ____________________________________________    [id number]  Dg Chest Port 1 View  Result Date: 01/12/2019 CLINICAL DATA:  Weakness EXAM: PORTABLE CHEST 1 VIEW COMPARISON:  None. FINDINGS: Cardiac shadow is mildly enlarged but accentuated by the portable technique. The lungs are well aerated bilaterally. No bony abnormality is seen. IMPRESSION: No acute abnormality noted. Electronically Signed   By: Inez Catalina M.D.   On: 01/12/2019 15:38    ____________________________________________   PROCEDURES .Critical Care Performed by: Carrie Mew, MD Authorized by: Carrie Mew, MD   Critical care provider statement:    Critical care time (minutes):  35   Critical care time was exclusive  of:  Separately billable procedures and treating other patients   Critical care was necessary to treat or  prevent imminent or life-threatening deterioration of the following conditions:  Sepsis   Critical care was time spent personally by me on the following activities:  Development of treatment plan with patient or surrogate, discussions with consultants, evaluation of patient's response to treatment, examination of patient, obtaining history from patient or surrogate, ordering and performing treatments and interventions, ordering and review of laboratory studies, ordering and review of radiographic studies, pulse oximetry, re-evaluation of patient's condition and review of old charts    ____________________________________________    CLINICAL IMPRESSION / [place] / ED COURSE  Pertinent labs & imaging results that were available during my care of the patient were reviewed by me and considered in my medical decision making (see chart for details).    Patient presents with tachycardia tachypnea, pronounced leukocytosis, likely due to sepsis from urinary source.  Urinalysis is grossly positive, labs also reveal hyponatremia and AKI, lactate 3.2, consistent with severe sepsis.  Since the source is clearly defined, will give IV fluids for hydration and resuscitation and IV ceftriaxone.  Plan to discuss with hospitalist for admission.  Would fluid resuscitate before attempting pharmacologic rate control of her A. fib as tachycardia may be compensatory..      ____________________________________________   FINAL CLINICAL IMPRESSION(S) / ED DIAGNOSES    Final diagnoses:  Severe sepsis ([place])  Cystitis  AKI (acute kidney injury) ([place])  Type 2 diabetes mellitus with other specified complication, unspecified whether long term insulin use ([place])  Hyponatremia  Atrial fibrillation with rapid ventricular response ([place])  Acute respiratory failure with hypoxia [facility])     ED Discharge  Orders    None      Portions of this note were generated with dragon dictation software. Dictation errors may occur despite best attempts at proofreading.   Carrie Mew, MD 01/12/19 1630

## 2019-01-12 NOTE — H&P (Signed)
[place] [facility] [place] NAME: Jenny Hensley    MR#:  [id number]  DATE OF BIRTH:  1951/11/11  DATE OF ADMISSION:  01/12/2019  PRIMARY CARE PHYSICIAN: Marguerita Merles, MD   REQUESTING/REFERRING PHYSICIAN: Joni Fears  CHIEF COMPLAINT:  it hurts when I urinate  HISTORY OF PRESENT ILLNESS:  Jenny Hensley  is a 68 y.o. female with a known history of chronic atrial fibrillation on oral anticoagulation, morbid obesity, CAD, hypertension, diabetes comes to the emergency room with increasing dysuria weakness and chills. Patient was found to be septic with elevated white count, lactic acid and tachycardia. She was noted to have urinary tract infection. She's being admitted with sepsis secondary to UTI  in the ER patient's heart rate was in the 130s to 140s blood pressure was stable received some IV metoprolol. She received a dose of IV Rocephin and IV fluids  Patient reports she is off her Xarelto for last two days for a planned colonoscopy on Tuesday.  PAST MEDICAL HISTORY:   Past Medical History:  Diagnosis Date  . A-fib ([place])   . Coronary artery disease   . Depression   . Diabetes mellitus without complication ([place])   . Environmental allergies   . Heart chamber dysfunction   . Hypertension   . PONV (postoperative nausea and vomiting)     PAST SURGICAL HISTOIRY:   Past Surgical History:  Procedure Laterality Date  . ABDOMINAL HYSTERECTOMY    . APPENDECTOMY    . CHOLECYSTECTOMY    . COLON SURGERY    . COLONOSCOPY WITH PROPOFOL N/A 04/24/2018   Procedure: COLONOSCOPY WITH PROPOFOL;  Surgeon: Jonathon Bellows, MD;  Location: [facility] ENDOSCOPY;  Service: Gastroenterology;  Laterality: N/A;  . CORONARY ANGIOPLASTY WITH STENT PLACEMENT    . JOINT REPLACEMENT Left 2003   knee  . KNEE SURGERY    . NASAL SINUS SURGERY    . NECK SURGERY    . SHOULDER ARTHROSCOPY WITH OPEN ROTATOR CUFF REPAIR Left 01/18/2018   Procedure: SHOULDER ARTHROSCOPY WITH  OPEN ROTATOR CUFF REPAIR,SUBACROMINAL DECOMPRESSION, DISTAL CLAVICLE EXCISION,BICEPS TENOTOMY;  Surgeon: Thornton Park, MD;  Location: [facility] ORS;  Service: Orthopedics;  Laterality: Left;  . SMALL INTESTINE SURGERY      SOCIAL HISTORY:   Social History   Tobacco Use  . Smoking status: Current Every Day Smoker    Packs/day: 0.25    Types: Cigarettes  . Smokeless tobacco: Never Used  . Tobacco comment: 3 cigarettes per day   Substance Use Topics  . Alcohol use: No    FAMILY HISTORY:   Family History  Problem Relation Age of Onset  . Breast cancer Neg Hx     DRUG ALLERGIES:   Allergies  Allergen Reactions  . Nsaids Hives    Can take otc ibuprofen    REVIEW OF SYSTEMS:  Review of Systems  Constitutional: Positive for chills. Negative for fever and weight loss.  HENT: Negative for ear discharge, ear pain and nosebleeds.   Eyes: Negative for blurred vision, pain and discharge.  Respiratory: Negative for sputum production, shortness of breath, wheezing and stridor.   Cardiovascular: Negative for chest pain, palpitations, orthopnea and PND.  Gastrointestinal: Positive for abdominal pain. Negative for diarrhea, nausea and vomiting.  Genitourinary: Positive for dysuria and frequency. Negative for urgency.  Musculoskeletal: Positive for back pain. Negative for joint pain.  Neurological: Negative for sensory change, speech change, focal weakness and weakness.  Psychiatric/Behavioral: Negative for depression and hallucinations. The patient  is not nervous/anxious.      MEDICATIONS AT HOME:   Prior to Admission medications   Medication Sig Start Date End Date Taking? Authorizing Provider  acetaminophen (TYLENOL 8 HOUR ARTHRITIS PAIN) 650 MG CR tablet Take 1,950 mg by mouth 2 (two) times daily as needed for pain.    [provider]  buPROPion (WELLBUTRIN XL) 150 MG 24 hr tablet Take 150 mg by mouth daily.     [provider]  cetirizine (ZYRTEC ALLERGY) 10 MG  tablet Take 1 tablet (10 mg total) by mouth daily. 12/24/18   Wilhelmina Mcardle, MD  diphenhydrAMINE (BENADRYL ALLERGY) 25 MG tablet Take 25 mg by mouth 2 (two) times daily as needed (for allergies/congestion.).    [provider]  Dulaglutide (TRULICITY) 5.63 OV/5.6EP SOPN Trulicity 3.29 JJ/8.8 mL subcutaneous pen injector    [provider]  ezetimibe (ZETIA) 10 MG tablet Take 10 mg by mouth daily.    [provider]  fluticasone (FLONASE) 50 MCG/ACT nasal spray Place 1-2 sprays into both nostrils daily as needed for allergies or rhinitis.    [provider]  glyBURIDE (DIABETA) 5 MG tablet Take 10 mg by mouth 2 (two) times daily.    [provider]  ibuprofen (ADVIL,MOTRIN) 200 MG tablet Take 400 mg by mouth 2 (two) times daily as needed (FOR PAIN.).    [provider]  Insulin Degludec (TRESIBA FLEXTOUCH) 200 UNIT/ML SOPN Tresiba FlexTouch U-200 insulin 200 unit/mL (3 mL) subcutaneous pen    [provider]  Insulin Detemir (LEVEMIR FLEXTOUCH) 100 UNIT/ML Pen Inject 64 Units into the skin 2 (two) times daily.     [provider]  insulin lispro (HUMALOG KWIKPEN) 100 UNIT/ML KiwkPen Inject 20-40 Units into the skin 5 (five) times daily. SLIDING SCALE INSULIN    [provider]  ketoconazole (NIZORAL) 2 % cream ketoconazole 2 % topical cream    [provider]  linagliptin (TRADJENTA) 5 MG TABS tablet Take 5 mg by mouth daily.    [provider]  lisinopril-hydrochlorothiazide (PRINZIDE,ZESTORETIC) 10-12.5 MG tablet Take 1 tablet by mouth daily.    [provider]  metoprolol tartrate (LOPRESSOR) 25 MG tablet Take 0.625 mg by mouth daily.     [provider]  mometasone (ELOCON) 0.1 % lotion mometasone 0.1 % topical solution    [provider]  oxyCODONE (OXY IR/ROXICODONE) 5 MG immediate release tablet Take 1 tablet (5 mg total) by mouth every 4 (four) hours as needed for  severe pain. 01/18/18   Thornton Park, MD  oxyCODONE (OXY IR/ROXICODONE) 5 MG immediate release tablet oxycodone 5 mg tablet    [provider]  rivaroxaban (XARELTO) 20 MG TABS tablet Take 20 mg by mouth daily.    [provider]  Zoster Vaccine Live, PF, (ZOSTAVAX) 41660 UNT/0.65ML injection Zostavax (PF) 19,400 unit/0.65 mL subcutaneous suspension    [provider]      VITAL SIGNS:  Blood pressure (!) 145/94, pulse 89, temperature 99.2 F (37.3 C), temperature source Oral, resp. rate 16, height 5\' 7"  (1.702 m), weight 98 kg, SpO2 98 %.  PHYSICAL EXAMINATION:  GENERAL:  68 y.o.-year-old patient lying in the bed with no acute distress. Appears ill EYES: Pupils equal, round, reactive to light and accommodation. No scleral icterus. Extraocular muscles intact.  HEENT: Head atraumatic, normocephalic. Oropharynx and nasopharynx clear.  NECK:  Supple, no jugular venous distention. No thyroid enlargement, no tenderness.  LUNGS: Normal breath sounds bilaterally, no wheezing, rales,rhonchi  or crepitation. No use of accessory muscles of respiration.  CARDIOVASCULAR: S1, S2 normal. No murmurs, rubs, or gallops. Tachycardia ABDOMEN: Soft, nontender, nondistended. Bowel sounds present. No organomegaly or mass. Flank tenderness EXTREMITIES: No pedal edema, cyanosis, or clubbing.  NEUROLOGIC: Cranial nerves II through XII are intact. Muscle strength 5/5 in all extremities. Sensation intact. Gait not checked.  PSYCHIATRIC: The patient is alert and oriented x 3.  SKIN: No obvious rash, lesion, or ulcer.   LABORATORY PANEL:   CBC Recent Labs  Lab 01/12/19 1448  WBC 26.7*  HGB 12.6  HCT 37.1  PLT 182   ------------------------------------------------------------------------------------------------------------------  Chemistries  Recent Labs  Lab 01/12/19 1448  NA 127*  K 3.8  CL 95*  CO2 21*  GLUCOSE 331*  BUN 42*  CREATININE 1.60*  CALCIUM 8.2*    ------------------------------------------------------------------------------------------------------------------  Cardiac Enzymes No results for input(s): TROPONINI in the last 168 hours. ------------------------------------------------------------------------------------------------------------------  RADIOLOGY:  Dg Chest Port 1 View  Result Date: 01/12/2019 CLINICAL DATA:  Weakness EXAM: PORTABLE CHEST 1 VIEW COMPARISON:  None. FINDINGS: Cardiac shadow is mildly enlarged but accentuated by the portable technique. The lungs are well aerated bilaterally. No bony abnormality is seen. IMPRESSION: No acute abnormality noted. Electronically Signed   By: Inez Catalina M.D.   On: 01/12/2019 15:38    EKG:   Sinus tachycardia IMPRESSION AND PLAN:    Jenny Hensley  is a 68 y.o. female with a known history of chronic atrial fibrillation on oral anticoagulation, morbid obesity, CAD, hypertension, diabetes comes to the emergency room with increasing dysuria weakness and chills. Patient was found to be septic with elevated white count, lactic acid and tachycardia.  1. Sepsis due to UTI -patient presented with dysuria, flank pain elevated white count elevated lactic acid and tachycardia -IV fluids, IV Rocephin, follow blood culture urine culture -follow-up lactic acid -follow-up CBC  2. Acute on chronic a fib with RVR in the setting of sepsis -IV metoprolol 5 mg Q 4 PRN for heart rate greater than 100 -consider Cardizem drip if needed  3. Chronic oral anticoagulation with Xarelto for a fib -patient Xarelto has been held for last two days in anticipation first plaintiff scheduled colonoscopy coming Tuesday -given sepsis and UTI colonoscopy will have to be rescheduled -at present I will give patient Lovenox subcu BID and at discharge can be resume back on Xarelto. This was discussed with patient's husband and patient. -Patient's colonoscopy will have to be rescheduled at a later date  37.  Diabetes -sliding scale insulin  5. Chronic renal insufficiency -avoid nephrotoxic ends -monitor I and all  6. DVT prophylaxis subcu Lovenox  Discussed with ICU attending Dr. Jamal Collin  All the records are reviewed and case discussed with ED provider.   CODE STATUS: full  TOTAL TIME TAKING CARE OF THIS PATIENT: *55* minutes.    Fritzi Mandes M.D on 01/12/2019 at 5:10 PM  Between 7am to 6pm - Pager - [phone]  After 6pm go to [email or web address] - password [email or web address] [facility]  [facility]  Office  [phone]  CC: Primary care physician; Marguerita Merles, MD

## 2019-01-12 NOTE — Progress Notes (Signed)
Family Meeting Note  Advance Directive:yes Today a meeting took place with the patient and husband came in with dysuria found to have sepsis secondary to UTI with elevated lactic acid tachycardia and leukocytosis. Patient being admitted to ICU. Discuss code status patient wishes to be full code. Husband in the room agrees with it.  Time spent during discussion: 16 minutes Fritzi Mandes, MD

## 2019-01-12 NOTE — Consult Note (Signed)
PULMONARY / CRITICAL CARE MEDICINE  Name: STEFFANI DIONISIO MRN: [id number] DOB: 11/09/1951    LOS: 0  Referring Provider:  Dr Posey Pronto Reason for Referral: Sepsis Brief patient description:  68 y/o female admitted with urosepsis  HPI: This is a 68 y/o female who presented to the ED with dysuria and foul smelling urine x 2 weeks. Today she became altered, tachycardia, and weak hence she went to an urgent care center for evaluation. From the urgent care center, she was sent to the ED for evaluation. Her ED work up was significant for a lactic acid level of 3.2, WBC 26.7K, and creatinine of 1.6 up from her baseline of 0.84. She was started on broad spectrum antibiotics and admitted to the ICU.  This evening she is c/o diffuse abdominal pain and mild chest pressure. She remains febrile and tachycardic. Describes abdominal pain a dull and diffuse. Reports no BM in 3 days. Denies dyspnea, nausea and vomiting.   Past Medical History:  Diagnosis Date  . A-fib ([place])   . Coronary artery disease   . Depression   . Diabetes mellitus without complication ([place])   . Environmental allergies   . Heart chamber dysfunction   . Hypertension   . PONV (postoperative nausea and vomiting)    Past Surgical History:  Procedure Laterality Date  . ABDOMINAL HYSTERECTOMY    . APPENDECTOMY    . CHOLECYSTECTOMY    . COLON SURGERY    . COLONOSCOPY WITH PROPOFOL N/A 04/24/2018   Procedure: COLONOSCOPY WITH PROPOFOL;  Surgeon: Jonathon Bellows, MD;  Location: [facility] ENDOSCOPY;  Service: Gastroenterology;  Laterality: N/A;  . CORONARY ANGIOPLASTY WITH STENT PLACEMENT    . JOINT REPLACEMENT Left 2003   knee  . KNEE SURGERY    . NASAL SINUS SURGERY    . NECK SURGERY    . SHOULDER ARTHROSCOPY WITH OPEN ROTATOR CUFF REPAIR Left 01/18/2018   Procedure: SHOULDER ARTHROSCOPY WITH OPEN ROTATOR CUFF REPAIR,SUBACROMINAL DECOMPRESSION, DISTAL CLAVICLE EXCISION,BICEPS TENOTOMY;  Surgeon: Thornton [place], MD;  Location: [facility] ORS;   Service: Orthopedics;  Laterality: Left;  . SMALL INTESTINE SURGERY     Prior to Admission medications   Medication Sig Start Date End Date Taking? Authorizing Provider  amLODipine (NORVASC) 5 MG tablet Take 5 mg by mouth daily.   Yes [provider]  clopidogrel (PLAVIX) 75 MG tablet Take 75 mg by mouth daily.   Yes [provider]  donepezil (ARICEPT) 5 MG tablet Take 1 tablet (5 mg total) by mouth at bedtime. 08/13/18 09/22/18 Yes Sowles, Drue Stager, MD  empagliflozin (JARDIANCE) 25 MG TABS tablet Take 25 mg by mouth daily.   Yes [provider]  glycopyrrolate (ROBINUL) 1 MG tablet Take 1 mg by mouth 2 (two) times daily.   Yes [provider]  insulin aspart (NOVOLOG FLEXPEN) 100 UNIT/ML FlexPen Inject 12 Units into the skin 2 (two) times daily.   Yes [provider]  insulin aspart (NOVOLOG) 100 UNIT/ML FlexPen Inject 18 Units into the skin daily. At 1700   Yes [provider]  Insulin Degludec-Liraglutide (XULTOPHY) 100-3.6 UNIT-MG/ML SOPN Inject 50 Units into the skin daily.   Yes [provider]  levETIRAcetam (KEPPRA) 500 MG tablet Take 500 mg by mouth 2 (two) times daily.   Yes [provider]  lipase/protease/amylase (CREON) 12000 units CPEP capsule Take 6,000 Units by mouth 3 (three) times daily before meals.   Yes [provider]  lipase/protease/amylase (CREON) 12000 units CPEP capsule Take 3,000 Units by  mouth at bedtime. With snack   Yes [provider]  lisinopril (PRINIVIL,ZESTRIL) 5 MG tablet Take 5 mg by mouth daily.   Yes [provider]  metoprolol succinate (TOPROL-XL) 25 MG 24 hr tablet Take 1 tablet (25 mg total) by mouth daily. 08/13/18  Yes Sowles, Drue Stager, MD  rosuvastatin (CRESTOR) 40 MG tablet Take 1 tablet (40 mg total) by mouth daily. 08/13/18 09/22/18 Yes Steele Sizer, MD  aspirin EC 81 MG tablet Take 81 mg by mouth daily.    [provider]  famotidine (PEPCID)  20 MG tablet Take 1 tablet (20 mg total) by mouth 2 (two) times daily. 08/13/18 09/12/18  Steele Sizer, MD  gabapentin (NEURONTIN) 300 MG capsule Take 1 capsule (300 mg total) by mouth 2 (two) times daily. 08/13/18 09/12/18  Steele Sizer, MD  insulin glargine (LANTUS) 100 UNIT/ML injection Inject 0.1 mLs (10 Units total) into the skin daily. 08/13/18 09/12/18  Steele Sizer, MD  lacosamide 100 MG TABS Take 1 tablet (100 mg total) by mouth 2 (two) times daily. Patient not taking: Reported on 09/22/2018 12/29/17   Fritzi Mandes, MD  promethazine (PHENERGAN) 12.5 MG tablet Take 1 tablet (12.5 mg total) by mouth every 6 (six) hours as needed for nausea or vomiting. Patient not taking: Reported on 09/22/2018 10/17/17   Stark Klein, MD  sertraline (ZOLOFT) 25 MG tablet Take 1 tablet (25 mg total) by mouth daily. Patient not taking: Reported on 09/22/2018 08/13/18   Steele Sizer, MD   Allergies Allergies  Allergen Reactions  . Nsaids Hives    Can take otc ibuprofen    Family History Family History  Problem Relation Age of Onset  . Breast cancer Neg Hx    Social History  reports that she has been smoking cigarettes. She has been smoking about 0.25 packs per day. She has never used smokeless tobacco. She reports that she does not drink alcohol or use drugs.  Review Of Systems:   Constitutional: Positive for generalized malaise, fever and chills.  HENT: Negative for congestion and rhinorrhea.  Eyes: Negative for redness and visual disturbance.  Respiratory: Negative for shortness of breath and wheezing.  Cardiovascular: positive for chest pressure and palpitations.  Gastrointestinal: Negative  for nausea , vomiting but positive for abdominal pain and  constipation Genitourinary: positive for dysuria and urgency.  Endocrine: Denies polyuria, polyphagia and heat intolerance Musculoskeletal: Positive for generalized weakness Skin: Negative for pallor and wound.  Neurological: Negative for  dizziness and headaches   VITAL SIGNS: BP 140/73   Pulse (!) 119   Temp (!) 103 F (39.4 C) (Oral)   Resp (!) 41   Ht 5\' 7"  (1.702 m)   Wt 97.5 kg   SpO2 95%   BMI 33.67 kg/m   HEMODYNAMICS:    VENTILATOR SETTINGS:    INTAKE / OUTPUT: I/O last 3 completed shifts: In: 3100 [IV Piggyback:3100] Out: -   PHYSICAL EXAMINATION: General:  Acutely ill-looking, in no distress HEENT: PERRLA, trachea midline, no JVD Neuro: Alert and oriented x3, no focal deficits Cardiovascular: Apical pulse tachycardic, S1-S2, no murmur regurg or gallop, +2 pulses bilaterally, no edema Lungs: Clear to auscultation bilaterally Abdomen: Distended, hypoactive bowel sounds, palpation reveals diffuse tenderness Musculoskeletal: No joint deformities, positive range of motion Skin: Dry Genitourinary: Mild suprapubic tenderness, no CVA tenderness LABS:  BMET Recent Labs  Lab 01/12/19 1448  NA 127*  K 3.8  CL 95*  CO2 21*  BUN 42*  CREATININE 1.60*  GLUCOSE 331*  Electrolytes Recent Labs  Lab 01/12/19 1448  CALCIUM 8.2*    CBC Recent Labs  Lab 01/12/19 1448  WBC 26.7*  HGB 12.6  HCT 37.1  PLT 182    Coag's No results for input(s): APTT, INR in the last 168 hours.  Sepsis Markers Recent Labs  Lab 01/12/19 1448 01/12/19 1625  LATICACIDVEN 3.2* 2.4*    ABG No results for input(s): PHART, PCO2ART, PO2ART in the last 168 hours.  Liver Enzymes No results for input(s): AST, ALT, ALKPHOS, BILITOT, ALBUMIN in the last 168 hours.  Cardiac Enzymes No results for input(s): TROPONINI, PROBNP in the last 168 hours.  Glucose Recent Labs  Lab 01/12/19 1818  GLUCAP 182*    Imaging Dg Chest Port 1 View  Result Date: 01/12/2019 CLINICAL DATA:  Weakness EXAM: PORTABLE CHEST 1 VIEW COMPARISON:  None. FINDINGS: Cardiac shadow is mildly enlarged but accentuated by the portable technique. The lungs are well aerated bilaterally. No bony abnormality is seen. IMPRESSION: No acute  abnormality noted. Electronically Signed   By: Inez Catalina M.D.   On: 01/12/2019 15:38   STUDIES:  None  CULTURES: Blood cultures x2 Urine culture  ANTIBIOTICS: Ceftriaxone  SIGNIFICANT EVENTS: 01/12/2019: Admitted  LINES/TUBES: Peripheral IV  DISCUSSION: 68 year old female admitted with urosepsis, acute renal failure and constipation  ASSESSMENT  Urosepsis Acute renal failure UTI Type 2 diabetes mellitus Atrial fibrillation Hypertension Constipation on abdominal x-ray   PLAN More dynamic monitoring per ICU protocol Follow-up cultures Antibiotics as above Monitor and correct electrolytes IV hydration Monitor I's and O's and trend creatinine Trend procalcitonin and adjust antibiotics Blood glucose monitoring with sliding scale insulin coverage Monitor fever curve and give Tylenol for temperature greater than 100 F Resume all home medications MiraLAX and milk of mag for constipation  Best Practice: Code Status: Full code Diet: Carb modified diet GI prophylaxis: Not indicated VTE prophylaxis: Lovenox  FAMILY  - Updates: Family at bedside.  Will update when available  Magdalene S. [facility] ANP-BC Pulmonary and Critical Care Medicine [facility] Pager [phone] or [phone]  NB: This document was prepared using Dragon voice recognition software and may include unintentional dictation errors.    01/12/2019, 7:43 PM

## 2019-01-12 NOTE — ED Notes (Signed)
Pt states she has hx of a-fib.

## 2019-01-12 NOTE — Plan of Care (Signed)
Pt admitted from ER for sepsis possibly secondary to UTI. Antibiotics and fluids given at the ER. Will keep on monitoring.

## 2019-01-12 NOTE — ED Triage Notes (Addendum)
Pt presents from urgent care via [facility] with c/o weakness and painful urination. Pt states she has had dysuria for about 1 week and has noticed foul odor to urine. Husband informed EMS that pt has also had altered mental status at home, pt is currently alert and oriented x4. BP 99/60 for ems, HR 140, RR 35. Blood sugar 369. Temp currently 99.2. Oxygen at urgent care was 88 on room air. Pt currently saturating at 95% on room air at this time. Pt received 200 cc of normal saline in route to er.

## 2019-01-12 NOTE — ED Notes (Signed)
Date and time results received: 01/12/19 1530   Test: Lactic Acid Critical Value: 3.2  Name of Provider Notified: Dr. Florestine Avers  Orders Received? Or Actions Taken?: Orders Received - See Orders for details

## 2019-01-12 NOTE — Progress Notes (Signed)
CODE SEPSIS - PHARMACY COMMUNICATION  **Broad Spectrum Antibiotics should be administered within 1 hour of Sepsis diagnosis**  Time Code Sepsis Called/Page Received: 1505  Antibiotics Ordered: Ceftriaxone 2gm  Time of 1st antibiotic administration: 1550   Additional action taken by pharmacy: Called RN @ 1535 to administer ABX  If necessary, Name of Provider/Nurse Contacted: Shane Crutch ,RPh 01/12/2019  3:54 PM

## 2019-01-12 NOTE — ED Notes (Signed)
HR 129,BP 141/93 at time of metoprolol administration.

## 2019-01-13 ENCOUNTER — Inpatient Hospital Stay: Payer: Medicare Other

## 2019-01-13 DIAGNOSIS — I4891 Unspecified atrial fibrillation: Secondary | ICD-10-CM

## 2019-01-13 DIAGNOSIS — R0600 Dyspnea, unspecified: Secondary | ICD-10-CM

## 2019-01-13 LAB — BLOOD CULTURE ID PANEL (REFLEXED)
Acinetobacter baumannii: NOT DETECTED
Candida albicans: NOT DETECTED
Candida glabrata: NOT DETECTED
Candida krusei: NOT DETECTED
Candida parapsilosis: NOT DETECTED
Candida tropicalis: NOT DETECTED
Carbapenem resistance: NOT DETECTED
Enterobacter cloacae complex: NOT DETECTED
Enterobacteriaceae species: DETECTED — AB
Enterococcus species: NOT DETECTED
Escherichia coli: DETECTED — AB
Haemophilus influenzae: NOT DETECTED
Klebsiella oxytoca: NOT DETECTED
Klebsiella pneumoniae: NOT DETECTED
Listeria monocytogenes: NOT DETECTED
NEISSERIA MENINGITIDIS: NOT DETECTED
Proteus species: NOT DETECTED
Pseudomonas aeruginosa: NOT DETECTED
STREPTOCOCCUS AGALACTIAE: NOT DETECTED
STREPTOCOCCUS PNEUMONIAE: NOT DETECTED
Serratia marcescens: NOT DETECTED
Staphylococcus aureus (BCID): NOT DETECTED
Staphylococcus species: NOT DETECTED
Streptococcus pyogenes: NOT DETECTED
Streptococcus species: NOT DETECTED

## 2019-01-13 LAB — BASIC METABOLIC PANEL
Anion gap: 6 (ref 5–15)
BUN: 41 mg/dL — ABNORMAL HIGH (ref 8–23)
CO2: 22 mmol/L (ref 22–32)
CREATININE: 1.32 mg/dL — AB (ref 0.44–1.00)
Calcium: 7.3 mg/dL — ABNORMAL LOW (ref 8.9–10.3)
Chloride: 105 mmol/L (ref 98–111)
GFR calc Af Amer: 48 mL/min — ABNORMAL LOW (ref >60)
GFR calc non Af Amer: 42 mL/min — ABNORMAL LOW (ref >60)
Glucose, Bld: 149 mg/dL — ABNORMAL HIGH (ref 70–99)
Potassium: 3.8 mmol/L (ref 3.5–5.1)
Sodium: 133 mmol/L — ABNORMAL LOW (ref 135–145)

## 2019-01-13 LAB — CBC
HEMATOCRIT: 33.4 % — AB (ref 36.0–46.0)
HEMOGLOBIN: 11.3 g/dL — AB (ref 12.0–15.0)
MCH: 30.9 pg (ref 26.0–34.0)
MCHC: 33.8 g/dL (ref 30.0–36.0)
MCV: 91.3 fL (ref 80.0–100.0)
Platelets: 163 10*3/uL (ref 150–400)
RBC: 3.66 MIL/uL — ABNORMAL LOW (ref 3.87–5.11)
RDW: 13.2 % (ref 11.5–15.5)
WBC: 26.9 10*3/uL — ABNORMAL HIGH (ref 4.0–10.5)
nRBC: 0 % (ref 0.0–0.2)

## 2019-01-13 LAB — GLUCOSE, CAPILLARY
Glucose-Capillary: 163 mg/dL — ABNORMAL HIGH (ref 70–99)
Glucose-Capillary: 340 mg/dL — ABNORMAL HIGH (ref 70–99)
Glucose-Capillary: 364 mg/dL — ABNORMAL HIGH (ref 70–99)
Glucose-Capillary: 350 mg/dL — ABNORMAL HIGH (ref 70–99)

## 2019-01-13 LAB — PROCALCITONIN: Procalcitonin: 5.64 ng/mL

## 2019-01-13 LAB — TROPONIN I
Troponin I: <0.03 ng/mL (ref <0.03)
Troponin I: 0.04 ng/mL (ref <0.03)

## 2019-01-13 LAB — BRAIN NATRIURETIC PEPTIDE: B NATRIURETIC PEPTIDE 5: 393 pg/mL — AB (ref 0.0–100.0)

## 2019-01-13 LAB — PHOSPHORUS: Phosphorus: 2.4 mg/dL — ABNORMAL LOW (ref 2.5–4.6)

## 2019-01-13 MED ORDER — K PHOS MONO-SOD PHOS DI & MONO 155-852-130 MG PO TABS
500.0000 mg | ORAL_TABLET | Freq: Two times a day (BID) | ORAL | Status: AC
  Administered 2019-01-13 (×2): 500 mg via ORAL
  Filled 2019-01-13 (×2): qty 2

## 2019-01-13 MED ORDER — METOPROLOL TARTRATE 25 MG PO TABS
25.0000 mg | ORAL_TABLET | Freq: Two times a day (BID) | ORAL | Status: DC
  Administered 2019-01-13 – 2019-01-15 (×5): 25 mg via ORAL
  Filled 2019-01-13 (×5): qty 1

## 2019-01-13 MED ORDER — SODIUM CHLORIDE 0.9 % IV SOLN
1.0000 g | Freq: Two times a day (BID) | INTRAVENOUS | Status: DC
  Administered 2019-01-13 – 2019-01-15 (×5): 1 g via INTRAVENOUS
  Filled 2019-01-13 (×6): qty 1

## 2019-01-13 MED ORDER — FUROSEMIDE 10 MG/ML IJ SOLN
40.0000 mg | Freq: Once | INTRAMUSCULAR | Status: AC
  Administered 2019-01-13: 40 mg via INTRAVENOUS
  Filled 2019-01-13: qty 4

## 2019-01-13 MED ORDER — DILTIAZEM HCL-DEXTROSE 100-5 MG/100ML-% IV SOLN (PREMIX)
5.0000 mg/h | INTRAVENOUS | Status: DC
  Administered 2019-01-13 (×2): 5 mg/h via INTRAVENOUS
  Filled 2019-01-13 (×2): qty 100

## 2019-01-13 NOTE — Progress Notes (Signed)
PHARMACY - PHYSICIAN COMMUNICATION CRITICAL VALUE ALERT - BLOOD CULTURE IDENTIFICATION (BCID)  THETIS SCHWIMMER is an 68 y.o. female who presented to [facility] on 01/12/2019 with a chief complaint of   Assessment:  3/4 GNR, E. coli (include suspected source if known)  Name of physician (or Provider) Contacted: [place]  Current antibiotics: Ceftriaxone  Changes to prescribed antibiotics recommended:  Changed to meropenem  No results found for this or any previous visit.  Lauran Romanski S 01/13/2019  4:19 AM

## 2019-01-13 NOTE — Progress Notes (Signed)
PULMONARY/CCM PROGRESS NOTE  PT PROFILE: 68 y.o. F admitted with severe sepsis due to urinary tract infection complicated by E. coli bacteremia    MICRO DATA: MRSA PCR 01/25 >> NEG Urine 1/25 >>   Blood 1/25 >> 2/2 E coli  ANTIMICROBIALS:  Anti-infectives (From admission, onward)   Start     Dose/Rate Route Frequency Ordered Stop   01/13/19 0430  meropenem (MERREM) 1 g in sodium chloride 0.9 % 100 mL IVPB     1 g 200 mL/hr over 30 Minutes Intravenous Every 12 hours 01/13/19 0418     01/12/19 1530  cefTRIAXone (ROCEPHIN) 2 g in sodium chloride 0.9 % 100 mL IVPB  Status:  Discontinued     2 g 200 mL/hr over 30 Minutes Intravenous Every 24 hours 01/12/19 1529 01/13/19 0418   01/12/19 1515  ceFEPIme (MAXIPIME) 2 g in sodium chloride 0.9 % 100 mL IVPB  Status:  Discontinued     2 g 200 mL/hr over 30 Minutes Intravenous  Once 01/12/19 1502 01/12/19 1529   01/12/19 1515  metroNIDAZOLE (FLAGYL) IVPB 500 mg  Status:  Discontinued     500 mg 100 mL/hr over 60 Minutes Intravenous Every 8 hours 01/12/19 1502 01/12/19 1529   01/12/19 1515  vancomycin (VANCOCIN) IVPB 1000 mg/200 mL premix  Status:  Discontinued     1,000 mg 200 mL/hr over 60 Minutes Intravenous  Once 01/12/19 1502 01/12/19 1529       SUBJ: No overt distress.  Cognition intact.  Reports mild dyspnea.  Denies pain.    OBJ: Vitals:   01/13/19 0800 01/13/19 0900 01/13/19 1000 01/13/19 1100  BP: 124/65 114/79 109/89 122/83  Pulse: (!) 133 (!) 127 (!) 131 (!) 123  Resp: (!) 24 (!) 29 (!) 24 (!) 29  Temp: 98.7 F (37.1 C)     TempSrc: Oral     SpO2: 97% 95% 98% 96%  Weight:      Height:      2 LPM [place]  Gen: Mildly tachypneic, no overt distress HEENT: NCAT, sclerae white Neck: No LAN, JVP not visualized Lungs: Bibasilar crackles, no wheezes Cardiovascular: IRIR, tachy, no M noted Abdomen: Soft, NT, +BS Ext: warm, no edema Neuro: PERRL, EOMI, motor/sensory grossly intact Skin: No lesions noted   BMP Latest Ref  Rng & Units 01/13/2019 01/12/2019 01/15/2018  Glucose 70 - 99 mg/dL 149(H) 331(H) 381(H)  BUN 8 - 23 mg/dL 41(H) 42(H) 32(H)  Creatinine 0.44 - 1.00 mg/dL 1.32(H) 1.60(H) 0.84  Sodium 135 - 145 mmol/L 133(L) 127(L) 134(L)  Potassium 3.5 - 5.1 mmol/L 3.8 3.8 4.3  Chloride 98 - 111 mmol/L 105 95(L) 101  CO2 22 - 32 mmol/L 22 21(L) 27  Calcium 8.9 - 10.3 mg/dL 7.3(L) 8.2(L) 9.1    No flowsheet data found.  CBC Latest Ref Rng & Units 01/13/2019 01/12/2019 01/15/2018  WBC 4.0 - 10.5 K/uL 26.9(H) 26.7(H) 8.6  Hemoglobin 12.0 - 15.0 g/dL 11.3(L) 12.6 14.5  Hematocrit 36.0 - 46.0 % 33.4(L) 37.1 43.9  Platelets 150 - 400 K/uL 163 182 207      ABG No results found for: PHART, PCO2ART, PO2ART, HCO3, TCO2, ACIDBASEDEF, O2SAT  CXR: CM, no edema   IMPRESSION: Severe sepsis E. coli bacteremia Atrial fibrillation with rapid ventricular response Mild dyspnea with crackles on exam  PLAN/REC: Change status to stepdown level Continue meropenem pending sensitivities Discontinue normal saline infusion Furosemide 40 mg x 1 dose (with potassium) Initiate diltiazem infusion.  HR goal 70-110/min Monitor BMET  intermittently Monitor I/Os Correct electrolytes as indicated   Merton Border, MD PCCM service Mobile [phone] Pager [phone] 01/13/2019 11:57 AM

## 2019-01-13 NOTE — Progress Notes (Signed)
[facility] [place] NAME: Makinlee Awwad    MR#:  [id number]  DATE OF BIRTH:  15-Jun-1951  SUBJECTIVE:   patient in stepdown unit Presented to the emergency room with generalized weakness and foul-smelling urine as well as abdominal pain.   REVIEW OF SYSTEMS:    Review of Systems  Constitutional: Negative for fever, chills weight loss HENT: Negative for ear pain, nosebleeds, congestion, facial swelling, rhinorrhea, neck pain, neck stiffness and ear discharge.   Respiratory: Negative for cough, shortness of breath, wheezing  Cardiovascular: Negative for chest pain, palpitations and leg swelling.  Gastrointestinal: Negative for heartburn, abdominal pain, vomiting, diarrhea or consitpation Genitourinary: Negative for dysuria, urgency, frequency, hematuria Musculoskeletal: Negative for back pain or joint pain Neurological: Negative for dizziness, seizures, syncope, focal weakness,  numbness and headaches.  Hematological: Does not bruise/bleed easily.  Psychiatric/Behavioral: Negative for hallucinations, confusion, dysphoric mood    Tolerating Diet: yes      DRUG ALLERGIES:   Allergies  Allergen Reactions  . Nsaids Hives    Can take otc ibuprofen    VITALS:  Blood pressure 122/83, pulse (!) 123, temperature 98.7 F (37.1 C), temperature source Oral, resp. rate (!) 29, height 5\' 7"  (1.702 m), weight 97.5 kg, SpO2 96 %.  PHYSICAL EXAMINATION:  Constitutional: Appears well-developed and well-nourished. No distress. HENT: Normocephalic. Marland Kitchen Oropharynx is clear and moist.  Eyes: Conjunctivae and EOM are normal. PERRLA, no scleral icterus.  Neck: Normal ROM. Neck supple. No JVD. No tracheal deviation. CVS: tachycardic irregular, irregular+, no murmurs, no gallops, no carotid bruit.  Pulmonary: Effort and breath sounds normal, no stridor, rhonchi, wheezes, rales.  Abdominal: Soft. BS +,  no distension, tenderness, rebound or guarding.   Musculoskeletal: Normal range of motion. No edema and no tenderness.  Neuro: Alert. CN 2-12 grossly intact. No focal deficits. Skin: Skin is warm and dry. No rash noted. Psychiatric: Normal mood and affect.      LABORATORY PANEL:   CBC Recent Labs  Lab 01/13/19 0128  WBC 26.9*  HGB 11.3*  HCT 33.4*  PLT 163   ------------------------------------------------------------------------------------------------------------------  Chemistries  Recent Labs  Lab 01/12/19 2009 01/13/19 0128  NA  --  133*  K  --  3.8  CL  --  105  CO2  --  22  GLUCOSE  --  149*  BUN  --  41*  CREATININE  --  1.32*  CALCIUM  --  7.3*  MG 2.0  --    ------------------------------------------------------------------------------------------------------------------  Cardiac Enzymes Recent Labs  Lab 01/12/19 2009 01/13/19 0128 01/13/19 0820  TROPONINI 0.03* <0.03 0.04*   ------------------------------------------------------------------------------------------------------------------  RADIOLOGY:  Dg Abd 1 View  Result Date: 01/12/2019 CLINICAL DATA:  Abdominal distension EXAM: ABDOMEN - 1 VIEW COMPARISON:  None. FINDINGS: Scattered large and small bowel gas is noted. No obstructive changes are noted. Mild retained fecal material is seen. Postoperative changes are noted in the pelvis as well as in the right upper quadrant. Degenerative changes of the lumbar spine are seen. No free air is noted. IMPRESSION: No acute abnormality noted. Electronically Signed   By: Inez Catalina M.D.   On: 01/12/2019 20:31   Dg Chest Port 1 View  Result Date: 01/13/2019 CLINICAL DATA:  Shortness of breath.  Respiratory failure. EXAM: PORTABLE CHEST 1 VIEW COMPARISON:  01/12/2019 FINDINGS: The heart size and mediastinal contours are within normal limits. Both lungs are clear. The visualized skeletal structures are unremarkable. IMPRESSION: No active disease. Electronically Signed  By: Kerby Moors M.D.   On:  01/13/2019 11:01   Dg Chest Port 1 View  Result Date: 01/12/2019 CLINICAL DATA:  Weakness EXAM: PORTABLE CHEST 1 VIEW COMPARISON:  None. FINDINGS: Cardiac shadow is mildly enlarged but accentuated by the portable technique. The lungs are well aerated bilaterally. No bony abnormality is seen. IMPRESSION: No acute abnormality noted. Electronically Signed   By: Inez Catalina M.D.   On: 01/12/2019 15:38     ASSESSMENT AND PLAN:   68 year old female with chronic atrial fibrillation on anticoagulation who presented to the emergency room due to generalized weakness and dysuria.  1.  Sepsis from E. coli bacteremia/UTI: Patient presents with  leukocytosis and tachycardia Sepsis is slowly resolving Follow-up on final cultures Continue ertapenem Follow CBC and procalcitonin level  2.  Acute kidney injury in setting of sepsis which has improved with IV fluids  3.  Electrolyte abnormalities including low phosphorus/magnesium: Replete   4.  Essential hypertension: Continue metoprolol/dilt gtt  5.  Diabetes: Use sliding scale  6.  Mild hyponatremia: This is resolving  7.  Atrial fibrillation with RVR: Continue diltiazem Should restart Xarelto  8.  Depression: Wellbutrin should be restarted  Management plans discussed with the patient and sge is in agreement.  CODE STATUS: full  TOTAL TIME TAKING CARE OF THIS PATIENT: 30 minutes.     POSSIBLE D/C 2-3 days  , DEPENDING ON CLINICAL CONDITION.   Ayven Glasco M.D on 01/13/2019 at 11:08 AM  Between 7am to 6pm - Pager - [phone] After 6pm go to [email or web address] - password [email or web address] [place] [facility]  Office  [phone]  CC: Primary care physician; Marguerita Merles, MD  Note: This dictation was prepared with Dragon dictation along with smaller phrase technology. Any transcriptional errors that result from this process are unintentional.

## 2019-01-14 DIAGNOSIS — R7881 Bacteremia: Secondary | ICD-10-CM

## 2019-01-14 DIAGNOSIS — I480 Paroxysmal atrial fibrillation: Secondary | ICD-10-CM

## 2019-01-14 LAB — CBC
HEMATOCRIT: 31 % — AB (ref 36.0–46.0)
Hemoglobin: 10.3 g/dL — ABNORMAL LOW (ref 12.0–15.0)
MCH: 30.7 pg (ref 26.0–34.0)
MCHC: 33.2 g/dL (ref 30.0–36.0)
MCV: 92.3 fL (ref 80.0–100.0)
Platelets: 198 10*3/uL (ref 150–400)
RBC: 3.36 MIL/uL — ABNORMAL LOW (ref 3.87–5.11)
RDW: 13.5 % (ref 11.5–15.5)
WBC: 24.3 10*3/uL — ABNORMAL HIGH (ref 4.0–10.5)
nRBC: 0 % (ref 0.0–0.2)

## 2019-01-14 LAB — BASIC METABOLIC PANEL
Anion gap: 6 (ref 5–15)
BUN: 41 mg/dL — ABNORMAL HIGH (ref 8–23)
CO2: 23 mmol/L (ref 22–32)
CREATININE: 1.48 mg/dL — AB (ref 0.44–1.00)
Calcium: 7.1 mg/dL — ABNORMAL LOW (ref 8.9–10.3)
Chloride: 103 mmol/L (ref 98–111)
GFR calc Af Amer: 42 mL/min — ABNORMAL LOW (ref >60)
GFR calc non Af Amer: 36 mL/min — ABNORMAL LOW (ref >60)
Glucose, Bld: 298 mg/dL — ABNORMAL HIGH (ref 70–99)
Potassium: 3.6 mmol/L (ref 3.5–5.1)
Sodium: 132 mmol/L — ABNORMAL LOW (ref 135–145)

## 2019-01-14 LAB — BRAIN NATRIURETIC PEPTIDE: B Natriuretic Peptide: 217 pg/mL — ABNORMAL HIGH (ref 0.0–100.0)

## 2019-01-14 LAB — PHOSPHORUS: PHOSPHORUS: 3.1 mg/dL (ref 2.5–4.6)

## 2019-01-14 LAB — MAGNESIUM: Magnesium: 2.1 mg/dL (ref 1.7–2.4)

## 2019-01-14 LAB — GLUCOSE, CAPILLARY
GLUCOSE-CAPILLARY: 272 mg/dL — AB (ref 70–99)
GLUCOSE-CAPILLARY: 388 mg/dL — AB (ref 70–99)
Glucose-Capillary: 323 mg/dL — ABNORMAL HIGH (ref 70–99)
Glucose-Capillary: 321 mg/dL — ABNORMAL HIGH (ref 70–99)

## 2019-01-14 MED ORDER — BUPROPION HCL ER (XL) 150 MG PO TB24
150.0000 mg | ORAL_TABLET | Freq: Every day | ORAL | Status: DC
  Administered 2019-01-14 – 2019-01-16 (×3): 150 mg via ORAL
  Filled 2019-01-14 (×3): qty 1

## 2019-01-14 MED ORDER — RIVAROXABAN 15 MG PO TABS: 15.0000 mg | ORAL_TABLET | Freq: Every day | ORAL | Status: DC

## 2019-01-14 MED ORDER — NYSTATIN 100000 UNIT/GM EX POWD
Freq: Three times a day (TID) | CUTANEOUS | Status: DC
  Administered 2019-01-14 – 2019-01-15 (×3): via TOPICAL
  Filled 2019-01-14: qty 15

## 2019-01-14 MED ORDER — INSULIN DETEMIR 100 UNIT/ML ~~LOC~~ SOLN
30.0000 [IU] | Freq: Two times a day (BID) | SUBCUTANEOUS | Status: DC
  Administered 2019-01-14 – 2019-01-16 (×4): 30 [IU] via SUBCUTANEOUS
  Filled 2019-01-14 (×6): qty 0.3

## 2019-01-14 MED ORDER — INSULIN ASPART 100 UNIT/ML ~~LOC~~ SOLN
10.0000 [IU] | Freq: Three times a day (TID) | SUBCUTANEOUS | Status: DC
  Administered 2019-01-14 – 2019-01-16 (×6): 10 [IU] via SUBCUTANEOUS
  Filled 2019-01-14 (×6): qty 1

## 2019-01-14 MED ORDER — RIVAROXABAN 20 MG PO TABS
20.0000 mg | ORAL_TABLET | Freq: Every day | ORAL | Status: DC
  Administered 2019-01-14 – 2019-01-15 (×2): 20 mg via ORAL
  Filled 2019-01-14 (×3): qty 1

## 2019-01-14 NOTE — Progress Notes (Signed)
PULMONARY/CCM PROGRESS NOTE   CC lethargy  HPI E colil bacteremia Feels better this AM Feeling hungry No SOB, CP On cardizem infusion     MICRO DATA: MRSA PCR 01/25 >> NEG Urine 1/25 >>   Blood 1/25 >> 2/2 E coli  ANTIMICROBIALS:  Anti-infectives (From admission, onward)   Start     Dose/Rate Route Frequency Ordered Stop   01/13/19 0430  meropenem (MERREM) 1 g in sodium chloride 0.9 % 100 mL IVPB     1 g 200 mL/hr over 30 Minutes Intravenous Every 12 hours 01/13/19 0418     01/12/19 1530  cefTRIAXone (ROCEPHIN) 2 g in sodium chloride 0.9 % 100 mL IVPB  Status:  Discontinued     2 g 200 mL/hr over 30 Minutes Intravenous Every 24 hours 01/12/19 1529 01/13/19 0418   01/12/19 1515  ceFEPIme (MAXIPIME) 2 g in sodium chloride 0.9 % 100 mL IVPB  Status:  Discontinued     2 g 200 mL/hr over 30 Minutes Intravenous  Once 01/12/19 1502 01/12/19 1529   01/12/19 1515  metroNIDAZOLE (FLAGYL) IVPB 500 mg  Status:  Discontinued     500 mg 100 mL/hr over 60 Minutes Intravenous Every 8 hours 01/12/19 1502 01/12/19 1529   01/12/19 1515  vancomycin (VANCOCIN) IVPB 1000 mg/200 mL premix  Status:  Discontinued     1,000 mg 200 mL/hr over 60 Minutes Intravenous  Once 01/12/19 1502 01/12/19 1529        OBJ: Vitals:   01/14/19 0300 01/14/19 0400 01/14/19 0500 01/14/19 0610  BP: 137/79 (!) 104/56 116/83 121/86  Pulse: 84 95 80 86  Resp: (!) 24 (!) 23  (!) 22  Temp:      TempSrc:      SpO2: 96% 96% 95% 97%  Weight:      Height:      2 LPM [place]   Review of Systems:  Gen:  Denies  fever, sweats, chills weigh loss  HEENT: Denies blurred vision, double vision, ear pain, eye pain, hearing loss, nose bleeds, sore throat Cardiac:  No dizziness, chest pain or heaviness, chest tightness,edema, No JVD Resp:   No cough, -sputum production, -shortness of breath,-wheezing, -hemoptysis,  Gi: Denies swallowing difficulty, stomach pain, nausea or vomiting, diarrhea, constipation, bowel  incontinence Gu:  Denies bladder incontinence, burning urine Ext:   Denies Joint pain, stiffness or swelling Skin: Denies  skin rash, easy bruising or bleeding or hives Endoc:  Denies polyuria, polydipsia , polyphagia or weight change Psych:   Denies depression, insomnia or hallucinations  Other:  All other systems negative   Physical Examination:   GENERAL:NAD, no fevers, chills, no weakness no fatigue HEAD: Normocephalic, atraumatic.  EYES: PERLA, EOMI No scleral icterus.  MOUTH: Moist mucosal membrane.  EAR, NOSE, THROAT: Clear without exudates. No external lesions.  NECK: Supple. No thyromegaly.  No JVD.  PULMONARY: CTA B/L no wheezing, rhonchi, crackles CARDIOVASCULAR: S1 and S2. Regular rate and rhythm. No murmurs GASTROINTESTINAL: Soft, nontender, nondistended. Positive bowel sounds.  MUSCULOSKELETAL: No swelling, clubbing, or edema.  NEUROLOGIC: No gross focal neurological deficits. 5/5 strength all extremities SKIN: No ulceration, lesions, rashes, or cyanosis.  PSYCHIATRIC: Insight, judgment intact. -depression -anxiety ALL OTHER ROS ARE NEGATIVE        BMP Latest Ref Rng & Units 01/14/2019 01/13/2019 01/12/2019  Glucose 70 - 99 mg/dL 298(H) 149(H) 331(H)  BUN 8 - 23 mg/dL 41(H) 41(H) 42(H)  Creatinine 0.44 - 1.00 mg/dL 1.48(H) 1.32(H) 1.60(H)  Sodium 135 -  145 mmol/L 132(L) 133(L) 127(L)  Potassium 3.5 - 5.1 mmol/L 3.6 3.8 3.8  Chloride 98 - 111 mmol/L 103 105 95(L)  CO2 22 - 32 mmol/L 23 22 21(L)  Calcium 8.9 - 10.3 mg/dL 7.1(L) 7.3(L) 8.2(L)    No flowsheet data found.  CBC Latest Ref Rng & Units 01/14/2019 01/13/2019 01/12/2019  WBC 4.0 - 10.5 K/uL 24.3(H) 26.9(H) 26.7(H)  Hemoglobin 12.0 - 15.0 g/dL 10.3(L) 11.3(L) 12.6  Hematocrit 36.0 - 46.0 % 31.0(L) 33.4(L) 37.1  Platelets 150 - 400 K/uL 198 163 182      ABG No results found for: PHART, PCO2ART, PO2ART, HCO3, TCO2, ACIDBASEDEF, O2SAT  CXR: CM, no edema   IMPRESSION: Severe sepsis with E coli  bacteremia complicated by Afib with RVR  Most likely from UTI   PLAN/REC: Continue ABX as prescribed Wean off cardizem as tolerated Advance diet  Consider transfer to gen med floor     Abel Ra Patricia Pesa, M.D.  [facility]  Medical Director [place] Director [facility] Cardio-Pulmonary Department

## 2019-01-14 NOTE — Progress Notes (Signed)
Pharmacy Antibiotic Note  Jenny Hensley is a 68 y.o. female admitted on 01/12/2019 with sepsis.  Pharmacy has been consulted for Meropenem dosing.  Plan: Meropenem 1g IV q12h  Height: 5\' 7"  (170.2 cm) Weight: 214 lb 15.2 oz (97.5 kg) IBW/kg (Calculated) : 61.6  Temp (24hrs), Avg:98.7 F (37.1 C), Min:97.8 F (36.6 C), Max:100.4 F (38 C)  Recent Labs  Lab 01/12/19 1448 01/12/19 1625 01/13/19 0128 01/14/19 0343  WBC 26.7*  --  26.9* 24.3*  CREATININE 1.60*  --  1.32* 1.48*  LATICACIDVEN 3.2* 2.4*  --   --     Estimated Creatinine Clearance: 44.3 mL/min (A) (by C-G formula based on SCr of 1.48 mg/dL (H)).    Allergies  Allergen Reactions  . Nsaids Hives    Can take otc ibuprofen    Antimicrobials this admission: Ceftriaxone 1/25 >> 1/25 Meropenem 1/26 >>   Microbiology results: 1/25 BCx: GNR 4/4 (BCID-E.coli) 1/25 UCx: E.coli  1/25 MRSA PCR: neg  Thank you for allowing pharmacy to be a part of this patient's care.  Paulina Fusi, PharmD, BCPS 01/14/2019 12:56 PM

## 2019-01-14 NOTE — Progress Notes (Signed)
Pt transferring to 1A[address]m 154. Report given to [place] [facility]. Allergic to NSAIDs. Pt alert and oriented. A-fib. On metoprolol. 2L [place]. Carb modified diet. BM 1/27.

## 2019-01-14 NOTE — Progress Notes (Signed)
Received pt from CCU. Husband at bedside. Pt is alert and oriented and able to verbalize needs. No complaints of pain at this time. On 2L O2. VSS. Telemetry on and verified with second nurse. Pt running Afib on monitor. No concerns voiced at this time. Will continue to monitor.  Bethann Punches, RN

## 2019-01-14 NOTE — Progress Notes (Addendum)
Inpatient Diabetes Program Recommendations  AACE/ADA: New Consensus Statement on Inpatient Glycemic Control (2015)  Target Ranges:  Prepandial:   less than 140 mg/dL      Peak postprandial:   less than 180 mg/dL (1-2 hours)      Critically ill patients:  140 - 180 mg/dL   Results for Jenny Hensley, Jenny Hensley (MRN [id number]) as of 01/14/2019 10:32  Ref. Range 01/13/2019 07:26 01/13/2019 12:56 01/13/2019 17:05 01/13/2019 21:43  Glucose-Capillary Latest Ref Range: 70 - 99 mg/dL 163 (H)  2 units NOVOLOG  340 (H)  7 units NOVOLOG  364 (H)  9 units NOVOLOG  350 (H)   Results for Jenny Hensley, Jenny Hensley (MRN [id number]) as of 01/14/2019 10:32  Ref. Range 01/14/2019 07:08  Glucose-Capillary Latest Ref Range: 70 - 99 mg/dL 272 (H)  5 units NOVOLOG     Admit with: Sepsis secondary to UTI  History: DM  Home DM Meds: Trulicity 2.35 mg Qweek       Glyburide 10 mg BID       Levemir 64 units BID       Humalog 20-40 units 5 times per day per SSI       Tradjenta 5 mg Daily  Current Orders: Novolog Sensitive Correction Scale/ SSI (0-9 units) TID AC   Endocrinologist: Dr. Lucilla Lame with [facility]--Last seen 08/2017       MD- Please consider the following in-hospital insulin adjustments:  1. Start Levemir 30 units BID (50% total home dose to start)  2. Start Novolog Meal Coverage: Novolog 10 units TID with meals (partial home dose)  (Please add the following Hold Parameters: Hold if pt eats <50% of meal, Hold if pt NPO)     --Will follow patient during hospitalization--  Wyn Quaker RN, MSN, CDE Diabetes Coordinator Inpatient Glycemic Control Team Team Pager: [phone] (8a-5p)

## 2019-01-14 NOTE — Progress Notes (Signed)
[place] at [place] NAME: Jenny Hensley    MR#:  [id number]  DATE OF BIRTH:  08-Apr-1951  SUBJECTIVE:   Pt. Here due to sepsis due to UTI.   Was noted to be in A. fib with RVR but heart rates have improved and patient has been weaned off the Cardizem drip.  Sensitivities on the urine and blood cultures are still pending.  REVIEW OF SYSTEMS:    Review of Systems  Constitutional: Negative for chills and fever.  HENT: Negative for congestion and tinnitus.   Eyes: Negative for blurred vision and double vision.  Respiratory: Negative for cough, shortness of breath and wheezing.   Cardiovascular: Negative for chest pain, orthopnea and PND.  Gastrointestinal: Negative for abdominal pain, diarrhea, nausea and vomiting.  Genitourinary: Negative for dysuria and hematuria.  Neurological: Positive for weakness (generalized). Negative for dizziness, sensory change and focal weakness.  All other systems reviewed and are negative.   Nutrition: Carb modifed Tolerating Diet: Yes Tolerating PT: Await Eval   DRUG ALLERGIES:   Allergies  Allergen Reactions  . Nsaids Hives    Can take otc ibuprofen    VITALS:  Blood pressure (!) 131/109, pulse 96, temperature 98.2 F (36.8 C), temperature source Oral, resp. rate (!) 25, height 5\' 7"  (1.702 m), weight 97.5 kg, SpO2 91 %.  PHYSICAL EXAMINATION:   Physical Exam  GENERAL:  68 y.o.-year-old obese patient lying in bed in NAD.  EYES: Pupils equal, round, reactive to light and accommodation. No scleral icterus. Extraocular muscles intact.  HEENT: Head atraumatic, normocephalic. Oropharynx and nasopharynx clear.  NECK:  Supple, no jugular venous distention. No thyroid enlargement, no tenderness.  LUNGS: Normal breath sounds bilaterally, no wheezing, rales, rhonchi. No use of accessory muscles of respiration.  CARDIOVASCULAR: S1, S2 normal. No murmurs, rubs, or gallops.  ABDOMEN: Soft, nontender,  nondistended. Bowel sounds present. No organomegaly or mass.  EXTREMITIES: No cyanosis, clubbing or edema b/l.    NEUROLOGIC: Cranial nerves II through XII are intact. No focal Motor or sensory deficits b/l. Globally weak  PSYCHIATRIC: The patient is alert and oriented x 3.  SKIN: No obvious rash, lesion, or ulcer.   + Foley cath in place with Yellow urine draining.   LABORATORY PANEL:   CBC Recent Labs  Lab 01/14/19 0343  WBC 24.3*  HGB 10.3*  HCT 31.0*  PLT 198   ------------------------------------------------------------------------------------------------------------------  Chemistries  Recent Labs  Lab 01/14/19 0343  NA 132*  K 3.6  CL 103  CO2 23  GLUCOSE 298*  BUN 41*  CREATININE 1.48*  CALCIUM 7.1*  MG 2.1   ------------------------------------------------------------------------------------------------------------------  Cardiac Enzymes Recent Labs  Lab 01/13/19 0820  TROPONINI 0.04*   ------------------------------------------------------------------------------------------------------------------  RADIOLOGY:  Dg Abd 1 View  Result Date: 01/12/2019 CLINICAL DATA:  Abdominal distension EXAM: ABDOMEN - 1 VIEW COMPARISON:  None. FINDINGS: Scattered large and small bowel gas is noted. No obstructive changes are noted. Mild retained fecal material is seen. Postoperative changes are noted in the pelvis as well as in the right upper quadrant. Degenerative changes of the lumbar spine are seen. No free air is noted. IMPRESSION: No acute abnormality noted. Electronically Signed   By: Inez Catalina M.D.   On: 01/12/2019 20:31   Dg Chest Port 1 View  Result Date: 01/13/2019 CLINICAL DATA:  Shortness of breath.  Respiratory failure. EXAM: PORTABLE CHEST 1 VIEW COMPARISON:  01/12/2019 FINDINGS: The heart size and mediastinal contours are within  normal limits. Both lungs are clear. The visualized skeletal structures are unremarkable. IMPRESSION: No active disease.  Electronically Signed   By: Kerby Moors M.D.   On: 01/13/2019 11:01   Dg Chest Port 1 View  Result Date: 01/12/2019 CLINICAL DATA:  Weakness EXAM: PORTABLE CHEST 1 VIEW COMPARISON:  None. FINDINGS: Cardiac shadow is mildly enlarged but accentuated by the portable technique. The lungs are well aerated bilaterally. No bony abnormality is seen. IMPRESSION: No acute abnormality noted. Electronically Signed   By: Inez Catalina M.D.   On: 01/12/2019 15:38     ASSESSMENT AND PLAN:   68 yo female w/ hx of HTN, DM, Depression, CAD, A. Fib, who presented to the hospital due to weakness and dysuria and noted to have UTI and bacteremia.   1.  Sepsis from E. coli bacteremia/UTI: Patient presents with  leukocytosis and tachycardia -Continue IV meropenem, await sensitivities on urine and blood cultures. - Patient is afebrile and hemodynamic stable and improving. WBC count improving.   2.  Acute kidney injury in setting of sepsis - improved with IV fluid hydration and will cont. To monitor.   3.  Atrial fibrillation with rapid ventricular response- was on Cardizem drip but now weaned off of it. -Continue oral Metoprolol.    4.  Electrolyte abnormalities including low phosphorus/magnesium:  - improved with supplementation.   5.  Essential hypertension: cont. Metoprolol - BP stable.   5.  Diabetes: cont. SSI.    6.  Mild hyponatremia: improving with supplementation.    All the records are reviewed and case discussed with Care Management/Social Worker. Management plans discussed with the patient, family and they are in agreement.  CODE STATUS: Full code  DVT Prophylaxis: Xarelto  TOTAL TIME TAKING CARE OF THIS PATIENT: 30 minutes.   POSSIBLE D/C in 2-3 DAYS, DEPENDING ON CLINICAL CONDITION and progress.   Henreitta Leber M.D on 01/14/2019 at 1:48 PM  Between 7am to 6pm - Pager - [phone]  After 6pm go to [email or web address] - password [email or web address] [place]  [facility]  Office  [phone]  CC: Primary care physician; Marguerita Merles, MD

## 2019-01-15 ENCOUNTER — Encounter: Payer: Self-pay | Admitting: Anesthesiology

## 2019-01-15 ENCOUNTER — Encounter
Admission: EM | Disposition: A | Discharge status: Home Health | Payer: Self-pay | Source: Home / Self Care | Attending: Specialist

## 2019-01-15 ENCOUNTER — Ambulatory Visit: Admission: RE | Admit: 2019-01-15 | Payer: Medicare Other | Source: Ambulatory Visit | Admitting: Gastroenterology

## 2019-01-15 LAB — CBC
HCT: 32.7 % — ABNORMAL LOW (ref 36.0–46.0)
Hemoglobin: 11 g/dL — ABNORMAL LOW (ref 12.0–15.0)
MCH: 31.1 pg (ref 26.0–34.0)
MCHC: 33.6 g/dL (ref 30.0–36.0)
MCV: 92.4 fL (ref 80.0–100.0)
Platelets: 283 10*3/uL (ref 150–400)
RBC: 3.54 MIL/uL — ABNORMAL LOW (ref 3.87–5.11)
RDW: 13.4 % (ref 11.5–15.5)
WBC: 21.5 10*3/uL — AB (ref 4.0–10.5)
nRBC: 0 % (ref 0.0–0.2)

## 2019-01-15 LAB — CULTURE, BLOOD (ROUTINE X 2)

## 2019-01-15 LAB — URINE CULTURE: Culture: >=100000 — AB

## 2019-01-15 LAB — BASIC METABOLIC PANEL
Anion gap: 5 (ref 5–15)
BUN: 35 mg/dL — ABNORMAL HIGH (ref 8–23)
CO2: 26 mmol/L (ref 22–32)
Calcium: 7.5 mg/dL — ABNORMAL LOW (ref 8.9–10.3)
Chloride: 105 mmol/L (ref 98–111)
Creatinine, Ser: 1.25 mg/dL — ABNORMAL HIGH (ref 0.44–1.00)
GFR calc non Af Amer: 44 mL/min — ABNORMAL LOW (ref >60)
GFR, EST AFRICAN AMERICAN: 52 mL/min — AB (ref >60)
Glucose, Bld: 154 mg/dL — ABNORMAL HIGH (ref 70–99)
Potassium: 3.8 mmol/L (ref 3.5–5.1)
Sodium: 136 mmol/L (ref 135–145)

## 2019-01-15 LAB — GLUCOSE, CAPILLARY
GLUCOSE-CAPILLARY: 115 mg/dL — AB (ref 70–99)
GLUCOSE-CAPILLARY: 133 mg/dL — AB (ref 70–99)
Glucose-Capillary: 109 mg/dL — ABNORMAL HIGH (ref 70–99)
Glucose-Capillary: 73 mg/dL (ref 70–99)
Glucose-Capillary: 191 mg/dL — ABNORMAL HIGH (ref 70–99)

## 2019-01-15 SURGERY — COLONOSCOPY WITH PROPOFOL
Anesthesia: General

## 2019-01-15 MED ORDER — METOPROLOL TARTRATE 25 MG PO TABS
25.0000 mg | ORAL_TABLET | Freq: Four times a day (QID) | ORAL | Status: DC
  Administered 2019-01-15 – 2019-01-16 (×4): 25 mg via ORAL
  Filled 2019-01-15 (×4): qty 1

## 2019-01-15 MED ORDER — SODIUM CHLORIDE 0.9 % IV SOLN
1.0000 g | Freq: Three times a day (TID) | INTRAVENOUS | Status: DC
  Filled 2019-01-15 (×2): qty 1

## 2019-01-15 MED ORDER — CEFAZOLIN SODIUM-DEXTROSE 2-4 GM/100ML-% IV SOLN
2.0000 g | Freq: Three times a day (TID) | INTRAVENOUS | Status: DC
  Administered 2019-01-15 – 2019-01-16 (×3): 2 g via INTRAVENOUS
  Filled 2019-01-15 (×5): qty 100

## 2019-01-15 NOTE — Progress Notes (Signed)
Pharmacy Antibiotic Note  Jenny Hensley is a 68 y.o. female admitted on 01/12/2019 with sepsis.  Pharmacy has been consulted for Meropenem dosing. Since admission her SCr has improved but is not yet back to her baseline of approximately 0.9 mg/dL. As of this note we do not yet have sensitivities for this E coli bacteremia and are still covering for ESBL organisms with meropenem. This is day #4 of antibiotic therapy.  Plan: Increase meropenem to 1g IV q8h  Height: 5\' 7"  (170.2 cm) Weight: 214 lb 15.2 oz (97.5 kg) IBW/kg (Calculated) : 61.6  Temp (24hrs), Avg:98.2 F (36.8 C), Min:97.6 F (36.4 C), Max:98.7 F (37.1 C)  Recent Labs  Lab 01/12/19 1448 01/12/19 1625 01/13/19 0128 01/14/19 0343 01/15/19 0416  WBC 26.7*  --  26.9* 24.3* 21.5*  CREATININE 1.60*  --  1.32* 1.48* 1.25*  LATICACIDVEN 3.2* 2.4*  --   --   --     Estimated Creatinine Clearance: 52.4 mL/min (A) (by C-G formula based on SCr of 1.25 mg/dL (H)).    Antimicrobials this admission: Ceftriaxone 1/25 >> 1/25 Meropenem 1/26 >>   Microbiology results: 1/25 BCx: GNR 4/4 (BCID-E.coli) 1/25 UCx: E.coli  1/25 MRSA PCR: neg  Thank you for allowing pharmacy to be a part of this patient's care.  Vallery Sa, PharmD 01/15/2019 7:50 AM

## 2019-01-15 NOTE — Progress Notes (Signed)
Offered patient bath.  She states she would prefer to take it later in the afternoon.

## 2019-01-15 NOTE — Care Management Important Message (Signed)
Important Message  Patient Details  Name: [name] MRN: [id number] Date of Birth: [birth year]   Medicare Important Message Given:  Yes    Juliann Pulse A Annina Piotrowski 01/15/2019, 11:21 AM

## 2019-01-15 NOTE — Progress Notes (Signed)
[place] at [place] NAME: Jenny Hensley    MR#:  [id number]  DATE OF BIRTH:  11/07/1951  SUBJECTIVE:   Transferred out of the ICU yesterday.  Heart rates remain still somewhat labile.  Still complaining of some generalized weakness.  Afebrile, hemodynamically stable.  REVIEW OF SYSTEMS:    Review of Systems  Constitutional: Negative for chills and fever.  HENT: Negative for congestion and tinnitus.   Eyes: Negative for blurred vision and double vision.  Respiratory: Negative for cough, shortness of breath and wheezing.   Cardiovascular: Negative for chest pain, orthopnea and PND.  Gastrointestinal: Negative for abdominal pain, diarrhea, nausea and vomiting.  Genitourinary: Negative for dysuria and hematuria.  Neurological: Positive for weakness (generalized). Negative for dizziness, sensory change and focal weakness.  All other systems reviewed and are negative.   Nutrition: Carb modifed Tolerating Diet: Yes Tolerating PT: Await Eval   DRUG ALLERGIES:   Allergies  Allergen Reactions  . Nsaids Hives    Can take otc ibuprofen    VITALS:  Blood pressure 130/83, pulse (!) 107, temperature 97.7 F (36.5 C), temperature source Oral, resp. rate 20, height 5\' 7"  (1.702 m), weight 97.5 kg, SpO2 97 %.  PHYSICAL EXAMINATION:   Physical Exam  GENERAL:  68 y.o.-year-old obese patient lying in bed in NAD.  EYES: Pupils equal, round, reactive to light and accommodation. No scleral icterus. Extraocular muscles intact.  HEENT: Head atraumatic, normocephalic. Oropharynx and nasopharynx clear.  NECK:  Supple, no jugular venous distention. No thyroid enlargement, no tenderness.  LUNGS: Normal breath sounds bilaterally, no wheezing, rales, rhonchi. No use of accessory muscles of respiration.  CARDIOVASCULAR: S1, S2 normal. No murmurs, rubs, or gallops.  ABDOMEN: Soft, nontender, nondistended. Bowel sounds present. No organomegaly or mass.    EXTREMITIES: No cyanosis, clubbing or edema b/l.    NEUROLOGIC: Cranial nerves II through XII are intact. No focal Motor or sensory deficits b/l. Globally weak  PSYCHIATRIC: The patient is alert and oriented x 3.  SKIN: No obvious rash, lesion, or ulcer.   + Foley cath in place with Yellow urine draining.   LABORATORY PANEL:   CBC Recent Labs  Lab 01/15/19 0416  WBC 21.5*  HGB 11.0*  HCT 32.7*  PLT 283   ------------------------------------------------------------------------------------------------------------------  Chemistries  Recent Labs  Lab 01/14/19 0343 01/15/19 0416  NA 132* 136  K 3.6 3.8  CL 103 105  CO2 23 26  GLUCOSE 298* 154*  BUN 41* 35*  CREATININE 1.48* 1.25*  CALCIUM 7.1* 7.5*  MG 2.1  --    ------------------------------------------------------------------------------------------------------------------  Cardiac Enzymes Recent Labs  Lab 01/13/19 0820  TROPONINI 0.04*   ------------------------------------------------------------------------------------------------------------------  RADIOLOGY:  No results found.   ASSESSMENT AND PLAN:   68 yo female w/ hx of HTN, DM, Depression, CAD, A. Fib, who presented to the hospital due to weakness and dysuria and noted to have UTI and bacteremia.   1.  Sepsis from E. coli bacteremia/UTI: Patient presents with  leukocytosis and tachycardia -Patient's blood cultures are positive for E. coli and it is fairly pansensitive.  Switch from IV meropenem to Ancef for now.  WBC count is improving, afebrile, hemodynamically stable this morning.  2.  Acute kidney injury in setting of sepsis - improved with IV fluid hydration and will cont. To monitor.   3.  Atrial fibrillation with rapid ventricular response- heart rates are still somewhat labile. -We will continue metoprolol but increase dose, continue  Xarelto.    4.  Electrolyte abnormalities including low phosphorus/magnesium:  - improved with  supplementation.   5.  Essential hypertension: cont. Metoprolol - BP stable.   6.  Diabetes: cont. SSI.    7.  Mild hyponatremia: resolved.   Will get PT eval and d/c foley today.    All the records are reviewed and case discussed with Care Management/Social Worker. Management plans discussed with the patient, family and they are in agreement.  CODE STATUS: Full code  DVT Prophylaxis: Xarelto  TOTAL TIME TAKING CARE OF THIS PATIENT: 30 minutes.   POSSIBLE D/C in 1-2 DAYS, DEPENDING ON CLINICAL CONDITION and progress.   Henreitta Leber M.D on 01/15/2019 at 1:08 PM  Between 7am to 6pm - Pager - [phone]  After 6pm go to [email or web address] - password [email or web address] [place] [facility]  Office  [phone]  CC: Primary care physician; Marguerita Merles, MD

## 2019-01-16 LAB — GLUCOSE, CAPILLARY
Glucose-Capillary: 123 mg/dL — ABNORMAL HIGH (ref 70–99)
Glucose-Capillary: 145 mg/dL — ABNORMAL HIGH (ref 70–99)

## 2019-01-16 LAB — CBC
HCT: 35.3 % — ABNORMAL LOW (ref 36.0–46.0)
Hemoglobin: 11.5 g/dL — ABNORMAL LOW (ref 12.0–15.0)
MCH: 30.6 pg (ref 26.0–34.0)
MCHC: 32.6 g/dL (ref 30.0–36.0)
MCV: 93.9 fL (ref 80.0–100.0)
Platelets: 336 10*3/uL (ref 150–400)
RBC: 3.76 MIL/uL — ABNORMAL LOW (ref 3.87–5.11)
RDW: 14 % (ref 11.5–15.5)
WBC: 19.4 10*3/uL — ABNORMAL HIGH (ref 4.0–10.5)
nRBC: 0 % (ref 0.0–0.2)

## 2019-01-16 LAB — BASIC METABOLIC PANEL
Anion gap: 6 (ref 5–15)
BUN: 27 mg/dL — ABNORMAL HIGH (ref 8–23)
CO2: 27 mmol/L (ref 22–32)
CREATININE: 1.04 mg/dL — AB (ref 0.44–1.00)
Calcium: 7.5 mg/dL — ABNORMAL LOW (ref 8.9–10.3)
Chloride: 104 mmol/L (ref 98–111)
GFR calc Af Amer: >60 mL/min (ref >60)
GFR calc non Af Amer: 56 mL/min — ABNORMAL LOW (ref >60)
GLUCOSE: 152 mg/dL — AB (ref 70–99)
Potassium: 4.3 mmol/L (ref 3.5–5.1)
Sodium: 137 mmol/L (ref 135–145)

## 2019-01-16 MED ORDER — CEPHALEXIN 500 MG PO CAPS: 500.0000 mg | ORAL_CAPSULE | Freq: Four times a day (QID) | ORAL | 0 refills | Status: AC

## 2019-01-16 MED ORDER — METOPROLOL TARTRATE 25 MG PO TABS: 25.0000 mg | ORAL_TABLET | Freq: Two times a day (BID) | ORAL | 1 refills | Status: AC

## 2019-01-16 MED ORDER — METOPROLOL TARTRATE 25 MG PO TABS: 25.0000 mg | ORAL_TABLET | Freq: Two times a day (BID) | ORAL | Status: DC

## 2019-01-16 NOTE — Progress Notes (Signed)
Patient is alert and oriented and able to verbalize needs. No complaints of pain. VSS. PIV removed. Family at the bedside. Telemetry discontinued. Discharge instructions and follow up care gone over with patient and family at this time. Printed AVS and hard scripts for Keflex and Metoprolol given to patient in discharge packet. No concerns voiced at this time.   Bethann Punches, RN

## 2019-01-16 NOTE — Discharge Summary (Signed)
[facility] NAME: Jenny Hensley    MR#:  [id number]  DATE OF BIRTH:  [birth year]  DATE OF ADMISSION:  01/12/2019 ADMITTING PHYSICIAN: Fritzi Mandes, MD  DATE OF DISCHARGE: 01/16/2019 12:25 PM  PRIMARY CARE PHYSICIAN: Marguerita Merles, MD    ADMISSION DIAGNOSIS:  Hyponatremia [E87.1] Atrial fibrillation with rapid ventricular response (HCC) [I48.91] Acute respiratory failure with hypoxia ([place]) [J96.01] AKI (acute kidney injury) ([place]) [N17.9] Cystitis [N30.90] Severe sepsis ([place]) [A41.9, R65.20] Type 2 diabetes mellitus with other specified complication, unspecified whether long term insulin use ([place]) [E11.69]  DISCHARGE DIAGNOSIS:  Active Problems:   Type 2 diabetes mellitus with other specified complication (HCC)   Sepsis (HCC)   Abdominal distention   AKI (acute kidney injury) ([place])   SECONDARY DIAGNOSIS:   Past Medical History:  Diagnosis Date  . A-fib ([place])   . Coronary artery disease   . Depression   . Diabetes mellitus without complication ([place])   . Environmental allergies   . Heart chamber dysfunction   . Hypertension   . PONV (postoperative nausea and vomiting)     HOSPITAL COURSE:   68 yo female w/ hx of HTN, DM, Depression, CAD, A. Fib, who presented to the hospital due to weakness and dysuria and noted to have UTI and bacteremia.   1. Sepsis from E. coli bacteremia/UTI: Patient presents with leukocytosis and tachycardia -Initially admitted to the intensive care unit placed on broad-spectrum IV antibiotics given IV fluids and vasopressors.  Much improved since then.  Initially also on IV meropenem and now has been switched over to IV Ancef and now being discharged on oral Keflex.  Patient's blood cultures are positive for E. coli which was pansensitive without any evidence of ESBL. -White cell count has trended down, patient remains afebrile and hemodynamic stable over the past 48 hours.  She is being discharged  home today.  2. Acute kidney injury in setting of sepsis -This is improved and resolved with IV fluid hydration.  3.  Atrial fibrillation with rapid ventricular response- she had some labile heart rates due to her sepsis and underlying illness.  Her metoprolol dose was advanced.  Heart rates have improved. -Patient will be discharged on her higher dose of metoprolol, she will continue her Xarelto.    4. Electrolyte abnormalities including low phosphorus/magnesium:  - improved and resolved with supplementation.  5. Essential hypertension: BP stable, patient will continue her metoprolol as stated below, along with her lisinopril/HCTZ..  6. Diabetes: While in the hospital patient was maintained on sliding scale insulin, she will resume her Levemir, Tradjenta, NovoLog with meals upon discharge.  Blood sugars remained stable while in the hospital.    7. Mild hyponatremia:  Secondary to underlying dehydration and poor p.o. intake.  Improved and resolved with IV fluid hydration.  8.  Anxiety-patient will continue her Wellbutrin.  DISCHARGE CONDITIONS:   Stable.   CONSULTS OBTAINED:    DRUG ALLERGIES:   Allergies  Allergen Reactions  . Nsaids Hives    Can take otc ibuprofen    DISCHARGE MEDICATIONS:   Allergies as of 01/16/2019      Reactions   Nsaids Hives   Can take otc ibuprofen      Medication List    STOP taking these medications   oxyCODONE 5 MG immediate release tablet Commonly known as:  Oxy IR/ROXICODONE     TAKE these medications   aspirin EC 81 MG tablet Take 81 mg by  mouth daily.   BENADRYL ALLERGY 25 MG tablet Generic drug:  diphenhydrAMINE Take 25 mg by mouth 2 (two) times daily as needed (for allergies/congestion.).   buPROPion 150 MG 24 hr tablet Commonly known as:  WELLBUTRIN XL Take 150 mg by mouth daily.   CENTRUM SILVER 50+WOMEN Tabs Take 1 tablet by mouth daily at 6 (six) AM.   cephALEXin 500 MG capsule Commonly known as:   KEFLEX Take 1 capsule (500 mg total) by mouth 4 (four) times daily for 10 days.   cetirizine 10 MG tablet Commonly known as:  ZYRTEC ALLERGY Take 1 tablet (10 mg total) by mouth daily.   ezetimibe 10 MG tablet Commonly known as:  ZETIA Take 10 mg by mouth daily.   fluticasone 50 MCG/ACT nasal spray Commonly known as:  FLONASE Place 1-2 sprays into both nostrils daily as needed for allergies or rhinitis.   glyBURIDE 5 MG tablet Commonly known as:  DIABETA Take 10 mg by mouth 2 (two) times daily.   HUMALOG KWIKPEN 100 UNIT/ML KiwkPen Generic drug:  insulin lispro Inject 20-40 Units into the skin 5 (five) times daily. SLIDING SCALE INSULIN   ibuprofen 200 MG tablet Commonly known as:  ADVIL,MOTRIN Take 400 mg by mouth 2 (two) times daily as needed (FOR PAIN.).   ketoconazole 2 % cream Commonly known as:  NIZORAL ketoconazole 2 % topical cream   LEVEMIR FLEXTOUCH 100 UNIT/ML Pen Generic drug:  Insulin Detemir Inject 64 Units into the skin 2 (two) times daily.   lisinopril-hydrochlorothiazide 10-12.5 MG tablet Commonly known as:  PRINZIDE,ZESTORETIC Take 1 tablet by mouth daily.   metoprolol tartrate 25 MG tablet Commonly known as:  LOPRESSOR Take 1 tablet (25 mg total) by mouth 2 (two) times daily. What changed:    how much to take  when to take this   mometasone 0.1 % lotion Commonly known as:  ELOCON mometasone 0.1 % topical solution   omeprazole 40 MG capsule Commonly known as:  PRILOSEC Take 40 mg by mouth daily at 6 (six) AM.   ondansetron 4 MG tablet Commonly known as:  ZOFRAN Take 4 mg by mouth every 6 (six) hours.   oxybutynin 5 MG tablet Commonly known as:  DITROPAN Take 5 mg by mouth 2 (two) times daily.   rivaroxaban 20 MG Tabs tablet Commonly known as:  XARELTO Take 20 mg by mouth daily.   TRADJENTA 5 MG Tabs tablet Generic drug:  linagliptin Take 5 mg by mouth daily.   TRULICITY 4.19 FX/9.0WI Sopn Generic drug:  Dulaglutide Trulicity  0.97 DZ/3.2 mL subcutaneous pen injector   TYLENOL 8 HOUR ARTHRITIS PAIN 650 MG CR tablet Generic drug:  acetaminophen Take 1,950 mg by mouth 2 (two) times daily as needed for pain.   ZOSTAVAX 99242 UNT/0.65ML injection Generic drug:  Zoster Vaccine Live (PF) Zostavax (PF) 19,400 unit/0.65 mL subcutaneous suspension         DISCHARGE INSTRUCTIONS:   DIET:  Cardiac diet and Diabetic diet  DISCHARGE CONDITION:  Stable  ACTIVITY:  Activity as tolerated  OXYGEN:  Home Oxygen: No.   Oxygen Delivery: room air  DISCHARGE LOCATION:  home   If you experience worsening of your admission symptoms, develop shortness of breath, life threatening emergency, suicidal or homicidal thoughts you must seek medical attention immediately by calling 911 or calling your MD immediately  if symptoms less severe.  You Must read complete instructions/literature along with all the possible adverse reactions/side effects for all the Medicines you take and  that have been prescribed to you. Take any new Medicines after you have completely understood and accpet all the possible adverse reactions/side effects.   Please note  You were cared for by a hospitalist during your hospital stay. If you have any questions about your discharge medications or the care you received while you were in the hospital after you are discharged, you can call the unit and asked to speak with the hospitalist on call if the hospitalist that took care of you is not available. Once you are discharged, your primary care physician will handle any further medical issues. Please note that NO REFILLS for any discharge medications will be authorized once you are discharged, as it is imperative that you return to your primary care physician (or establish a relationship with a primary care physician if you do not have one) for your aftercare needs so that they can reassess your need for medications and monitor your lab values.     Today    No acute events overnight.  Patient worked with physical therapy and the recommended home health services but patient is refusing.  Afebrile, hemodynamically stable.  White cell count is trending down.  Will discharge home today.  VITAL SIGNS:  Blood pressure 129/70, pulse 85, temperature 98 F (36.7 C), temperature source Oral, resp. rate 18, height 5\' 7"  (1.702 m), weight 97.5 kg, SpO2 97 %.  I/O:    Intake/Output Summary (Last 24 hours) at 01/16/2019 1448 Last data filed at 01/16/2019 0600 Gross per 24 hour  Intake 427.18 ml  Output -  Net 427.18 ml    PHYSICAL EXAMINATION:   GENERAL:  68 y.o.-year-old obese patient lying in bed in NAD.  EYES: Pupils equal, round, reactive to light and accommodation. No scleral icterus. Extraocular muscles intact.  HEENT: Head atraumatic, normocephalic. Oropharynx and nasopharynx clear.  NECK:  Supple, no jugular venous distention. No thyroid enlargement, no tenderness.  LUNGS: Normal breath sounds bilaterally, no wheezing, rales, rhonchi. No use of accessory muscles of respiration.  CARDIOVASCULAR: S1, S2 normal. No murmurs, rubs, or gallops.  ABDOMEN: Soft, nontender, nondistended. Bowel sounds present. No organomegaly or mass.  EXTREMITIES: No cyanosis, clubbing or edema b/l.    NEUROLOGIC: Cranial nerves II through XII are intact. No focal Motor or sensory deficits b/l. Globally weak  PSYCHIATRIC: The patient is alert and oriented x 3.  SKIN: No obvious rash, lesion, or ulcer.   DATA REVIEW:   CBC Recent Labs  Lab 01/16/19 0350  WBC 19.4*  HGB 11.5*  HCT 35.3*  PLT 336    Chemistries  Recent Labs  Lab 01/14/19 0343  01/16/19 0350  NA 132*   < > 137  K 3.6   < > 4.3  CL 103   < > 104  CO2 23   < > 27  GLUCOSE 298*   < > 152*  BUN 41*   < > 27*  CREATININE 1.48*   < > 1.04*  CALCIUM 7.1*   < > 7.5*  MG 2.1  --   --    < > = values in this interval not displayed.    Cardiac Enzymes Recent Labs  Lab 01/13/19 0820   TROPONINI 0.04*    Microbiology Results  Results for orders placed or performed during the hospital encounter of 01/12/19  Urine culture     Status: Abnormal   Collection Time: 01/12/19  2:48 PM  Result Value Ref Range Status   Specimen Description   Final  URINE, CATHETERIZED Performed at [facility], [address] [place], [place] [address]    Special Requests   Final    NONE Performed at [facility], [place], [place] [address]    Culture >=100,000 COLONIES/mL ESCHERICHIA COLI (A)  Final   Report Status 01/15/2019 FINAL  Final   Organism ID, Bacteria ESCHERICHIA COLI (A)  Final      Susceptibility   Escherichia coli - MIC*    AMPICILLIN >=32 RESISTANT Resistant     CEFAZOLIN <=4 SENSITIVE Sensitive     CEFTRIAXONE <=1 SENSITIVE Sensitive     CIPROFLOXACIN <=0.25 SENSITIVE Sensitive     GENTAMICIN <=1 SENSITIVE Sensitive     IMIPENEM <=0.25 SENSITIVE Sensitive     NITROFURANTOIN <=16 SENSITIVE Sensitive     TRIMETH/SULFA <=20 SENSITIVE Sensitive     AMPICILLIN/SULBACTAM 16 INTERMEDIATE Intermediate     PIP/TAZO <=4 SENSITIVE Sensitive     Extended ESBL NEGATIVE Sensitive     * >=100,000 COLONIES/mL ESCHERICHIA COLI  Blood Culture (routine x 2)     Status: Abnormal   Collection Time: 01/12/19  3:53 PM  Result Value Ref Range Status   Specimen Description   Final    BLOOD RIGHT FOREARM Performed at [facility], [place] [address], [place], [place] [address]    Special Requests   Final    BOTTLES DRAWN AEROBIC AND ANAEROBIC Blood Culture results may not be optimal due to an excessive volume of blood received in culture bottles Performed at [facility], [address] [place], [place] [address]    Culture  Setup Time   Final    GRAM NEGATIVE RODS IN BOTH AEROBIC AND ANAEROBIC BOTTLES CRITICAL RESULT CALLED TO, READ BACK BY AND VERIFIED WITH: MATT MCBANE @0359  ON 01/13/2019 BY FMW Performed at [facility] [place], [place] [address], [place], [place] [address]    Culture ESCHERICHIA COLI (A)  Final   Report Status 01/15/2019 FINAL  Final   Organism ID, Bacteria ESCHERICHIA COLI  Final      Susceptibility   Escherichia coli - MIC*    AMPICILLIN >=32 RESISTANT Resistant     CEFAZOLIN <=4 SENSITIVE Sensitive     CEFEPIME <=1 SENSITIVE Sensitive     CEFTAZIDIME <=1 SENSITIVE Sensitive     CEFTRIAXONE <=1 SENSITIVE Sensitive     CIPROFLOXACIN <=0.25 SENSITIVE Sensitive     GENTAMICIN <=1 SENSITIVE Sensitive     IMIPENEM <=0.25 SENSITIVE Sensitive     TRIMETH/SULFA <=20 SENSITIVE Sensitive     AMPICILLIN/SULBACTAM 16 INTERMEDIATE Intermediate     PIP/TAZO <=4 SENSITIVE Sensitive     Extended ESBL NEGATIVE Sensitive     * ESCHERICHIA COLI  Blood Culture (routine x 2)     Status: Abnormal   Collection Time: 01/12/19  3:53 PM  Result Value Ref Range Status   Specimen Description   Final    BLOOD LEFT ANTECUBITAL Performed at [facility] [address], [place], [place] [address]    Special Requests   Final    BOTTLES DRAWN AEROBIC AND ANAEROBIC Blood Culture results may not be optimal due to an excessive volume of blood received in culture bottles Performed at [facility], [address] [place], [place] [address]    Culture  Setup Time   Final    GRAM NEGATIVE RODS IN BOTH AEROBIC AND ANAEROBIC BOTTLES CRITICAL VALUE NOTED.  VALUE IS CONSISTENT WITH PREVIOUSLY REPORTED AND CALLED VALUE. Performed  at [facility], [address] [place], [place] [address]    Culture (A)  Final    ESCHERICHIA COLI SUSCEPTIBILITIES PERFORMED ON PREVIOUS CULTURE WITHIN THE LAST 5 DAYS. Performed at [facility], [place] [address], [place], [place] [address]    Report Status 01/15/2019 FINAL  Final  Blood Culture ID Panel (Reflexed)     Status: Abnormal   Collection Time: 01/12/19  3:53 PM  Result Value Ref Range Status   Enterococcus species NOT DETECTED NOT DETECTED Final    Listeria monocytogenes NOT DETECTED NOT DETECTED Final   Staphylococcus species NOT DETECTED NOT DETECTED Final   Staphylococcus aureus (BCID) NOT DETECTED NOT DETECTED Final   Streptococcus species NOT DETECTED NOT DETECTED Final   Streptococcus agalactiae NOT DETECTED NOT DETECTED Final   Streptococcus pneumoniae NOT DETECTED NOT DETECTED Final   Streptococcus pyogenes NOT DETECTED NOT DETECTED Final   Acinetobacter baumannii NOT DETECTED NOT DETECTED Final   Enterobacteriaceae species DETECTED (A) NOT DETECTED Final    Comment: Enterobacteriaceae represent a large family of gram-negative bacteria, not a single organism. CRITICAL RESULT CALLED TO, READ BACK BY AND VERIFIED WITH: MATT MCBANE @0359  ON 01/13/2019 BY FMW     Enterobacter cloacae complex NOT DETECTED NOT DETECTED Final   Escherichia coli DETECTED (A) NOT DETECTED Final    Comment: CRITICAL RESULT CALLED TO, READ BACK BY AND VERIFIED WITH: MATT MCBANE @0359  ON 01/13/2019 BY FMW    Klebsiella oxytoca NOT DETECTED NOT DETECTED Final   Klebsiella pneumoniae NOT DETECTED NOT DETECTED Final   Proteus species NOT DETECTED NOT DETECTED Final   Serratia marcescens NOT DETECTED NOT DETECTED Final   Carbapenem resistance NOT DETECTED NOT DETECTED Final   Haemophilus influenzae NOT DETECTED NOT DETECTED Final   Neisseria meningitidis NOT DETECTED NOT DETECTED Final   Pseudomonas aeruginosa NOT DETECTED NOT DETECTED Final   Candida albicans NOT DETECTED NOT DETECTED Final   Candida glabrata NOT DETECTED NOT DETECTED Final   Candida krusei NOT DETECTED NOT DETECTED Final   Candida parapsilosis NOT DETECTED NOT DETECTED Final   Candida tropicalis NOT DETECTED NOT DETECTED Final    Comment: Performed at [facility], [address] [place], [place] [address]  MRSA PCR Screening     Status: None   Collection Time: 01/12/19  6:19 PM  Result Value Ref Range Status   MRSA by PCR NEGATIVE NEGATIVE Final    Comment:        The  GeneXpert MRSA Assay (FDA approved for NASAL specimens only), is one component of a comprehensive MRSA colonization surveillance program. It is not intended to diagnose MRSA infection nor to guide or monitor treatment for MRSA infections. Performed at [facility], [address], [place], [place] [address]     RADIOLOGY:  No results found.    Management plans discussed with the patient, family and they are in agreement.  CODE STATUS:     Code Status Orders  (From admission, onward)         Start     Ordered   01/12/19 1940  Full code  Continuous     01/12/19 1939         TOTAL TIME TAKING CARE OF THIS PATIENT: 40 minutes.    Henreitta Leber M.D on 01/16/2019 at 2:48 PM  Between 7am to 6pm - Pager - [phone]  After 6pm go to [email or web address] - [facility]  Office  [phone]  CC:  Primary care physician; Marguerita Merles, MD

## 2019-01-16 NOTE — Evaluation (Signed)
Physical Therapy Evaluation Patient Details Name: Jenny Hensley MRN: [id number] DOB: 1951-01-13 Today's Date: 01/16/2019   History of Present Illness  Pt is a 68 y.o. female with a known history of chronic atrial fibrillation on oral anticoagulation, morbid obesity, CAD, hypertension, diabetes came to the emergency room with increasing dysuria weakness and chills. Patient was found to be septic with elevated white count, lactic acid and tachycardia.  She was noted to have urinary tract infection with sepsis.  In the ER patient's heart rate was in the 130s to 140s.  Assessment includes: Sepsis from E. coli bacteremia/UTI, Acute kidney injury in setting of sepsis, Atrial fibrillation with rapid ventricular response, Electrolyte abnormalities including low phosphorus/magnesium, HTN, DM, and mild hyponatremia.      Clinical Impression  Pt presents with deficits in strength, transfers, gait, balance, and activity tolerance.  Pt was Ind with good speed and effort with bed mobility tasks without the need of the bed rail.  Pt showed good eccentric and concentric control during sit to/from stand transfers without an AD but upon standing would attempt to reach for something sturdy for support.  Pt declined to try an AD in standing.  Pt was able to amb 1 x 30' and 1 x 100' without an AD with slow, cautious gait with short B step length and mild drifting L/R.  Again the pt steadied herself with the wall hand rail during ambulation but declined trial of using any AD with ambulation.  Pt advised to use an AD during amb for deceased fall risk and increased confidence with ambulation with pt again declining.  SpO2 checked frequently during session and remained >/= 95% throughout.  Pt will benefit from HHPT services upon discharge to safely address above deficits for decreased fall risk and eventual return to PLOF.       Follow Up Recommendations Home health PT    Equipment Recommendations  None recommended by PT     Recommendations for Other Services       Precautions / Restrictions Precautions Precautions: None Restrictions Weight Bearing Restrictions: No      Mobility  Bed Mobility Overal bed mobility: Independent                Transfers Overall transfer level: Needs assistance Equipment used: None Transfers: Sit to/from Stand Sit to Stand: Supervision         General transfer comment: Good eccentric and concentric control during sit to/from stand transfers with pt looking to steady herself in standing by reaching for something sturdy but declining the use of an AD  Ambulation/Gait Ambulation/Gait assistance: Min guard Gait Distance (Feet): 100 Feet x 1, 30 Feet x 1 Assistive device: None Gait Pattern/deviations: Drifts right/left;Step-through pattern;Decreased step length - right;Decreased step length - left Gait velocity: Decreased   General Gait Details: Slow, cautious gait with short B step length and mild drifting L/R with pt looking to steady herself with the wall/rail during ambulation but declining trial of using any AD  Stairs            Wheelchair Mobility    Modified Rankin (Stroke Patients Only)       Balance Overall balance assessment: Mild deficits observed, not formally tested                                           Pertinent Vitals/Pain Pain Assessment: No/denies pain  Home Living Family/patient expects to be discharged to:: Private residence Living Arrangements: Non-relatives/Friends Available Help at Discharge: Friend(s);Available 24 hours/day Type of Home: House Home Access: Level entry     Home Layout: One level Home Equipment: Grab bars - tub/shower;Other (comment);Cane - single point Additional Comments: Owns a rollator    Prior Function Level of Independence: Independent         Comments: Ind amb without an AD limited community distances, no fall history, Ind with ADLs     Hand Dominance         Extremity/Trunk Assessment   Upper Extremity Assessment Upper Extremity Assessment: Overall WFL for tasks assessed    Lower Extremity Assessment Lower Extremity Assessment: Generalized weakness       Communication   Communication: No difficulties  Cognition Arousal/Alertness: Awake/alert Behavior During Therapy: WFL for tasks assessed/performed Overall Cognitive Status: Within Functional Limits for tasks assessed                                        General Comments      Exercises Other Exercises Other Exercises: Static and dynamic balance training during functional tasks including reaching outside BOS   Assessment/Plan    PT Assessment Patient needs continued PT services  PT Problem List Decreased strength;Decreased activity tolerance;Decreased balance;Decreased safety awareness       PT Treatment Interventions DME instruction;Gait training;Functional mobility training;Therapeutic activities;Therapeutic exercise;Balance training;Patient/family education    PT Goals (Current goals can be found in the Care Plan section)  Acute Rehab PT Goals Patient Stated Goal: To return home  PT Goal Formulation: With patient Time For Goal Achievement: 01/29/19 Potential to Achieve Goals: Good    Frequency Min 2X/week   Barriers to discharge        Co-evaluation               AM-PAC PT "6 Clicks" Mobility  Outcome Measure Help needed turning from your back to your side while in a flat bed without using bedrails?: None Help needed moving from lying on your back to sitting on the side of a flat bed without using bedrails?: None Help needed moving to and from a bed to a chair (including a wheelchair)?: A Little Help needed standing up from a chair using your arms (e.g., wheelchair or bedside chair)?: A Little Help needed to walk in hospital room?: A Little Help needed climbing 3-5 steps with a railing? : A Little 6 Click Score: 20    End of  Session Equipment Utilized During Treatment: Gait belt Activity Tolerance: Patient tolerated treatment well Patient left: in bed;with call bell/phone within reach Nurse Communication: Mobility status;Other (comment)(Per nursing OK for trial session on room air and for pt to be left on room air at end of session with SpO2 in the mid 90s) PT Visit Diagnosis: Unsteadiness on feet (R26.81);Muscle weakness (generalized) (M62.81);Difficulty in walking, not elsewhere classified (R26.2)    Time: 5643-3295 PT Time Calculation (min) (ACUTE ONLY): 26 min   Charges:   PT Evaluation $PT Eval Low Complexity: 1 Low PT Treatments $Therapeutic Exercise: 8-22 mins        D. Scott Lanie Schelling PT, DPT 01/16/19, 10:27 AM

## 2019-01-16 NOTE — Progress Notes (Signed)
Volunteer services called to transport patient via wc to car. All belongings packed up by family.   Bethann Punches, RN

## 2019-01-16 NOTE — Care Management Note (Signed)
Case Management Note  Patient Details  Name: Jenny Hensley MRN: [id number] Date of Birth: January 11, 1951  Subjective/Objective:                  Met with patient to discuss DC plan and needs I provided the patient the [facility] list of choice per CMS.gov and explained that PT recommends her to have PT.  The patient stated that she is fine and has no needs and wants to decline any HH services or other needs at this time She said she is independent and will continue to be so. I encouraged the patient to take the list of agencies with her and my contact information and if she changes her mind to let me know, She agreed   Action/Plan: Provided [facility] list per CMS.gov   Expected Discharge Date:                  Expected Discharge Plan:     In-House Referral:     Discharge planning Services  CM Consult  Post Acute Care Choice:    Choice offered to:     DME Arranged:    DME Agency:     HH Arranged:  PT HH Agency:     Status of Service:  Completed, signed off  If discussed at [place] of Stay Meetings, dates discussed:    Additional Comments:  Su Hilt, RN 01/16/2019, 10:17 AM

## 2019-01-16 NOTE — Care Management Note (Signed)
Case Management Note  Patient Details  Name: Jenny Hensley MRN: [id number] Date of Birth: 08/25/1951  Subjective/Objective:                  Met with patient 1/28 at 1626, the system was down.  Late charting. Discussed Discharge needs and planning  Patient states that she lives with her boyfriend that is 68 years old.  He does help her and do the driving.  The patient is able to drive also.  She is independent at home and has the equipment such as a walker if she needs it but she does not feel she needs it. The patient stated that she does not need any type of PT or other [facility] services that she is able to do everything.  I asked if she would be open to discuss after PT has an opportunity to work with her and she agreed. The patient goes to the [facility] and gets her meds from there as well.  She stated that she has no problems getting to her appointments and no problem affording her medications. Will continue to monitor for needs  Action/Plan: Continue to monitor for needs  Expected Discharge Date:                  Expected Discharge Plan:     In-House Referral:     Discharge planning Services  CM Consult  Post Acute Care Choice:    Choice offered to:     DME Arranged:    DME Agency:     HH Arranged:  PT HH Agency:     Status of Service:  In process, will continue to follow  If discussed at Long Length of Stay Meetings, dates discussed:    Additional Comments:  Su Hilt, RN 01/16/2019, 8:18 AM

## 2019-01-16 NOTE — Progress Notes (Signed)
Able to wean pt to room air. No shortness of breath. Ambulating in room. O2 sats WDL. Will continue to monitor.   Bethann Punches, RN

## 2019-06-24 ENCOUNTER — Telehealth: Payer: Self-pay | Admitting: Urology

## 2019-06-24 NOTE — Telephone Encounter (Signed)
Patient has not been seen before unable to triage, please place patient on wait list or follow up with referring provider thanks

## 2019-06-24 NOTE — Telephone Encounter (Signed)
Pt wants to know if she can be seen sooner than 07/01/2019 she is having UTI symptoms.Please advise.

## 2019-07-01 ENCOUNTER — Other Ambulatory Visit: Payer: Self-pay

## 2019-07-01 ENCOUNTER — Encounter: Payer: Self-pay | Admitting: Urology

## 2019-07-01 ENCOUNTER — Ambulatory Visit: Payer: Medicare Other | Admitting: Urology

## 2019-07-01 VITALS — BP 130/68 | HR 47 | Ht 67.0 in | Wt 205.0 lb

## 2019-07-01 DIAGNOSIS — N390 Urinary tract infection, site not specified: Secondary | ICD-10-CM | POA: Diagnosis not present

## 2019-07-01 LAB — BLADDER SCAN AMB NON-IMAGING

## 2019-07-01 NOTE — Progress Notes (Signed)
07/01/19 4:22 PM   Zettie Pho 05/17/1951 [id number]  Referring provider: Marguerita Merles, MD [address] [place],  [place] [address]  CC: Recurrent UTIs  HPI: I saw Ms. Batchelder in urology clinic today in consultation from Dr. Lennox Grumbles for recurrent UTIs.  She is a comorbid 68 year old female with past medical history notable for atrial fibrillation on anticoagulation, CAD, depression, poorly controlled diabetes, and hypertension.  She reportedly had a severe E. coli UTI in January 2020 with resulting sepsis from urinary source requiring weeklong hospitalization.  She reports she has had ongoing UTIs since that time with multiple positive cultures despite multiple courses of antibiotics.  It is not completely clear from her history if she was having symptoms at the time of these cultures, or if this was simply asymptomatic bacteriuria.  She does report some history of pelvic pain, urgency, and frequency, however the time relationship with positive cultures is unclear.  She reports no prior UTIs before January 2020.  There is no prior imaging to review.  She has no history of nephrolithiasis.  She has baseline mild urge and stress urinary incontinence that is minimally bothersome.  She also has a history of bowel resection and diarrhea.  She is currently on Cipro since 06/25/2019, and denies any urinary symptoms today.  She denies a history of gross hematuria.  She is a lifelong smoker.  There are no aggravating or alleviating factors.  Severity is moderate.  PVR in clinic 1 cc.   PMH: Past Medical History:  Diagnosis Date  . A-fib ([place])   . Coronary artery disease   . Depression   . Diabetes mellitus without complication ([place])   . Environmental allergies   . Heart chamber dysfunction   . Hypertension   . PONV (postoperative nausea and vomiting)     Surgical History: Past Surgical History:  Procedure Laterality Date  . ABDOMINAL HYSTERECTOMY    . APPENDECTOMY    .  CHOLECYSTECTOMY    . COLON SURGERY    . COLONOSCOPY WITH PROPOFOL N/A 04/24/2018   Procedure: COLONOSCOPY WITH PROPOFOL;  Surgeon: Jonathon Bellows, MD;  Location: [facility] ENDOSCOPY;  Service: Gastroenterology;  Laterality: N/A;  . CORONARY ANGIOPLASTY WITH STENT PLACEMENT    . JOINT REPLACEMENT Left 2003   knee  . KNEE SURGERY    . NASAL SINUS SURGERY    . NECK SURGERY    . SHOULDER ARTHROSCOPY WITH OPEN ROTATOR CUFF REPAIR Left 01/18/2018   Procedure: SHOULDER ARTHROSCOPY WITH OPEN ROTATOR CUFF REPAIR,SUBACROMINAL DECOMPRESSION, DISTAL CLAVICLE EXCISION,BICEPS TENOTOMY;  Surgeon: Thornton Park, MD;  Location: [facility] ORS;  Service: Orthopedics;  Laterality: Left;  . SMALL INTESTINE SURGERY      Allergies:  Allergies  Allergen Reactions  . Ibuprofen Hives    Has built up an immunity now and can take 2 in morning and 2 at night  . Nsaids Hives    Can take otc ibuprofen    Family History: Family History  Problem Relation Age of Onset  . Breast cancer Neg Hx     Social History:  reports that she has been smoking cigarettes. She has been smoking about 0.25 packs per day. She has never used smokeless tobacco. She reports that she does not drink alcohol or use drugs.  ROS: Please see flowsheet from today's date for complete review of systems.  Physical Exam: BP 130/68 (BP Location: Left Arm, Patient Position: Sitting)   Pulse (!) 47   Ht 5\' 7"  (1.702 m)  Wt 205 lb (93 kg)   BMI 32.11 kg/m    Constitutional:  Alert and oriented, No acute distress. Cardiovascular: No clubbing, cyanosis, or edema. Respiratory: Normal respiratory effort, no increased work of breathing. GI: Abdomen is soft, nontender, nondistended, no abdominal masses GU: No CVA tenderness.  Pelvic exam with healthy-appearing periurethral tissue, no obvious urethral lesions Lymph: No cervical or inguinal lymphadenopathy. Skin: No rashes, bruises or suspicious lesions. Neurologic: Grossly intact, no focal deficits,  moving all 4 extremities. Psychiatric: Normal mood and affect.  Laboratory Data: Urinalysis today benign appearing  Pertinent Imaging: None to review  Assessment & Plan:   In summary, the patient is a 68 year old co-morbid female with poorly controlled diabetes and recurrent urinary infections since January 2020.  We discussed the evaluation and treatment of patients with recurrent UTIs at length.  We specifically discussed the differences between asymptomatic bacteriuria and true urinary tract infection.  We discussed the AUA definition of recurrent UTI of at least 2 culture proven symptomatic acute cystitis episodes in a 25-month period, or 3 within a 1 year period.  We discussed the importance of culture directed antibiotic treatment, and antibiotic stewardship.  First-line therapy includes nitrofurantoin(5 days), Bactrim(3 days), or fosfomycin(3 g single dose).  Possible etiologies of recurrent infection include periurethral tissue atrophy in postmenopausal woman, constipation, sexual activity, incomplete emptying, anatomic abnormalities, and even genetic predisposition.  We also discussed at length the role of adequate diabetes control and the risk of recurrent infections.  Finally, we discussed the role of perineal hygiene, timed voiding, adequate hydration, topical vaginal estrogen, cranberry prophylaxis, and low-dose antibiotic prophylaxis.  -I reinforced the difference between asymptomatic bacteriuria and true urinary tract infection, and only need for treatment of positive urine cultures if she is having urinary symptoms -Start cranberry tablet prophylaxis BID -CT urogram to evaluate for hydronephrosis, stones, or filling defects, call with results -RTC 3 months for symptom check, consider daily prophylaxis if ongoing symptomatic UTIs despite above interventions   Billey Co, [place] [address], [place], [place] [address] [phone]

## 2019-07-01 NOTE — Patient Instructions (Signed)

## 2019-07-22 ENCOUNTER — Ambulatory Visit
Admission: RE | Admit: 2019-07-22 | Discharge: 2019-07-22 | Disposition: A | Discharge status: Home / Self Care | Payer: Medicare Other | Source: Ambulatory Visit | Attending: Urology | Admitting: Urology

## 2019-07-22 ENCOUNTER — Other Ambulatory Visit: Payer: Self-pay

## 2019-07-22 DIAGNOSIS — N3289 Other specified disorders of bladder: Secondary | ICD-10-CM | POA: Insufficient documentation

## 2019-07-22 DIAGNOSIS — N390 Urinary tract infection, site not specified: Secondary | ICD-10-CM | POA: Diagnosis not present

## 2019-07-22 DIAGNOSIS — K869 Disease of pancreas, unspecified: Secondary | ICD-10-CM | POA: Insufficient documentation

## 2019-07-22 HISTORY — DX: Heart failure, unspecified: I50.9

## 2019-07-22 LAB — POCT I-STAT CREATININE: Creatinine, Ser: 1.1 mg/dL — ABNORMAL HIGH (ref 0.44–1.00)

## 2019-07-22 MED ORDER — IOHEXOL 300 MG/ML  SOLN
125.0000 mL | Freq: Once | INTRAMUSCULAR | Status: AC | PRN
  Administered 2019-07-22: 13:00:00 125 mL via INTRAVENOUS

## 2019-08-01 ENCOUNTER — Telehealth: Payer: Self-pay

## 2019-08-01 NOTE — Telephone Encounter (Signed)
-----   Message from Billey Co, MD sent at 08/01/2019  8:59 AM EDT ----- CT hematuria was performed for recurrent UTIs. Her urinary tract is all totally normal, no stones, kidney tumors, or hydronephrosis. There was a small 1cm lesion in the pancreas that radiology recommended an MRI. I forwarded the results to her PCP, and they should be calling her to set up the MRI.  Nickolas Madrid, MD 08/01/2019

## 2019-08-01 NOTE — Telephone Encounter (Signed)
Called pt no answer. Unable to leave voicemail as no DPR is on file. 1st attempt

## 2019-08-02 NOTE — Telephone Encounter (Signed)
Pt returning missed call about results. Please try again. Thanks

## 2019-08-05 NOTE — Telephone Encounter (Signed)
Tried calling patient. 3rd attempt. Letter sent.

## 2019-08-05 NOTE — Telephone Encounter (Signed)
Pt. Returning call about results. Patient states she screens her calls and if it doesn't say [facility] she wont answer?

## 2019-08-05 NOTE — Telephone Encounter (Signed)
Called pt no answer. Unable to leave message as there is no DPR on file. 2nd attempt

## 2019-08-06 NOTE — Telephone Encounter (Signed)
Pt LMOM returning call for CT results.

## 2019-08-13 ENCOUNTER — Other Ambulatory Visit: Payer: Self-pay | Admitting: Family Medicine

## 2019-08-13 DIAGNOSIS — K8689 Other specified diseases of pancreas: Secondary | ICD-10-CM

## 2019-08-22 ENCOUNTER — Other Ambulatory Visit: Payer: Self-pay

## 2019-08-22 ENCOUNTER — Ambulatory Visit
Admission: RE | Admit: 2019-08-22 | Discharge: 2019-08-22 | Disposition: A | Discharge status: Home / Self Care | Payer: Medicare Other | Source: Ambulatory Visit | Attending: Family Medicine | Admitting: Family Medicine

## 2019-08-22 DIAGNOSIS — K8689 Other specified diseases of pancreas: Secondary | ICD-10-CM | POA: Insufficient documentation

## 2019-08-22 MED ORDER — GADOBUTROL 1 MMOL/ML IV SOLN
9.0000 mL | Freq: Once | INTRAVENOUS | Status: AC | PRN
  Administered 2019-08-22: 9 mL via INTRAVENOUS

## 2019-10-01 ENCOUNTER — Encounter (INDEPENDENT_AMBULATORY_CARE_PROVIDER_SITE_OTHER): Payer: Self-pay

## 2019-10-01 ENCOUNTER — Other Ambulatory Visit: Payer: Self-pay

## 2019-10-01 ENCOUNTER — Ambulatory Visit: Payer: Medicare Other | Admitting: Gastroenterology

## 2019-10-01 ENCOUNTER — Ambulatory Visit (INDEPENDENT_AMBULATORY_CARE_PROVIDER_SITE_OTHER): Payer: Medicare Other | Admitting: Gastroenterology

## 2019-10-01 VITALS — BP 95/62 | HR 69 | Temp 98.3°F | Ht 67.0 in | Wt 215.0 lb

## 2019-10-01 DIAGNOSIS — Z86010 Personal history of colonic polyps: Secondary | ICD-10-CM | POA: Diagnosis not present

## 2019-10-01 DIAGNOSIS — K862 Cyst of pancreas: Secondary | ICD-10-CM

## 2019-10-01 NOTE — Progress Notes (Signed)
Jonathon Bellows MD, MRCP(U.K) [address]  [place]  [place], [place] [address]  Main: [phone]  Fax: [phone]   Gastroenterology Consultation  Referring Provider:     Marguerita Merles, MD Primary Care Physician:  Marguerita Merles, MD Primary Gastroenterologist:  Dr. Jonathon Bellows  Reason for Consultation:    Pancreatic cyst        HPI:   Jenny Hensley is a 68 y.o. y/o female referred for consultation & management  by Dr. Lennox Grumbles, Connye Burkitt, MD.     In May 2019 I performed a colonoscopy on her and found a large 20 mm polyp in the ascending colon which was resected via EMR taken out piecemeal and was a sessile serrated adenoma.  Suggested a repeat colonoscopy in 6 months but she did not follow-up.  She has been referred today for further evaluation of the pancreatic cyst.  Recently had a CT scan of the abdomen for hematuria work-up and showed cystitis and a 1.5 cm low-attenuation lesion in the posterior pancreatic head suspicious for cystic pancreatic lesion possible neoplasm.  Follow-up MRI on 08/22/2019 showed a 2.4 cystic lesion with multiple internal septations possibilities include serous cystadenoma versus intraductal papillary mucous neoplasm or postinflammatory cystic lesion.  Patient is here today to follow-up on this.  She denies any abdominal pain, weight loss, prior episodes of pancreatitis, family history of pancreatic cancer or recurrent pancreatitis.  She is a smoker.  Denies any excess alcohol consumption in the past.  This lesion was incidentally found.  She also says she is ready for her colonoscopy.  Past Medical History:  Diagnosis Date  . A-fib ([place])   . CHF (congestive heart failure) ([place])   . Coronary artery disease   . Depression   . Diabetes mellitus without complication ([place])   . Environmental allergies   . Heart chamber dysfunction   . Hypertension   . PONV (postoperative nausea and vomiting)     Past Surgical History:  Procedure Laterality Date   . ABDOMINAL HYSTERECTOMY    . APPENDECTOMY    . CHOLECYSTECTOMY    . COLON SURGERY    . COLONOSCOPY WITH PROPOFOL N/A 04/24/2018   Procedure: COLONOSCOPY WITH PROPOFOL;  Surgeon: Jonathon Bellows, MD;  [facility] ENDOSCOPY;  Service: Gastroenterology;  Laterality: N/A;  . CORONARY ANGIOPLASTY WITH STENT PLACEMENT    . JOINT REPLACEMENT Left 2003   knee  . KNEE SURGERY    . NASAL SINUS SURGERY    . NECK SURGERY    . SHOULDER ARTHROSCOPY WITH OPEN ROTATOR CUFF REPAIR Left 01/18/2018   Procedure: SHOULDER ARTHROSCOPY WITH OPEN ROTATOR CUFF REPAIR,SUBACROMINAL DECOMPRESSION, DISTAL CLAVICLE EXCISION,BICEPS TENOTOMY;  Surgeon: Thornton Park, MD;  Location: [facility] ORS;  Service: Orthopedics;  Laterality: Left;  . SMALL INTESTINE SURGERY      Prior to Admission medications   Medication Sig Start Date End Date Taking? Authorizing Provider  acetaminophen (TYLENOL 8 HOUR ARTHRITIS PAIN) 650 MG CR tablet Take 1,950 mg by mouth 2 (two) times daily as needed for pain.    [provider]  aspirin EC 81 MG tablet Take 81 mg by mouth daily.    [provider]  buPROPion (WELLBUTRIN XL) 150 MG 24 hr tablet Take 150 mg by mouth daily.     [provider]  cetirizine (ZYRTEC ALLERGY) 10 MG tablet Take 1 tablet (10 mg total) by mouth daily. 12/24/18   Wilhelmina Mcardle, MD  diphenhydrAMINE (BENADRYL ALLERGY) 25 MG tablet Take  25 mg by mouth 2 (two) times daily as needed (for allergies/congestion.).    [provider]  Dulaglutide (TRULICITY) A999333 0000000 SOPN Trulicity A999333 99991111 mL subcutaneous pen injector    [provider]  ezetimibe (ZETIA) 10 MG tablet Take 10 mg by mouth daily.    [provider]  fluticasone (FLONASE) 50 MCG/ACT nasal spray Place 1-2 sprays into both nostrils daily as needed for allergies or rhinitis.    [provider]  glyBURIDE (DIABETA) 5 MG tablet Take 10 mg by mouth 2 (two) times daily.    [provider]   ibuprofen (ADVIL,MOTRIN) 200 MG tablet Take 400 mg by mouth 2 (two) times daily as needed (FOR PAIN.).    [provider]  Insulin Detemir (LEVEMIR FLEXTOUCH) 100 UNIT/ML Pen Inject 64 Units into the skin 2 (two) times daily.     [provider]  insulin lispro (HUMALOG KWIKPEN) 100 UNIT/ML KiwkPen Inject 20-40 Units into the skin 5 (five) times daily. SLIDING SCALE INSULIN    [provider]  ketoconazole (NIZORAL) 2 % cream ketoconazole 2 % topical cream    [provider]  lactulose (CHRONULAC) 10 GM/15ML solution  04/09/19   [provider]  linagliptin (TRADJENTA) 5 MG TABS tablet Take 5 mg by mouth daily.    [provider]  lisinopril-hydrochlorothiazide (PRINZIDE,ZESTORETIC) 10-12.5 MG tablet Take 1 tablet by mouth daily.    [provider]  metFORMIN (GLUCOPHAGE) 500 MG tablet  06/25/19   [provider]  metoprolol tartrate (LOPRESSOR) 25 MG tablet Take 1 tablet (25 mg total) by mouth 2 (two) times daily. 01/16/19 03/17/19  Henreitta Leber, MD  mometasone (ELOCON) 0.1 % lotion mometasone 0.1 % topical solution    [provider]  Multiple Vitamins-Minerals (CENTRUM SILVER 50+WOMEN) TABS Take 1 tablet by mouth daily at 6 (six) AM.    [provider]  omeprazole (PRILOSEC) 40 MG capsule Take 40 mg by mouth daily at 6 (six) AM.    [provider]  ondansetron (ZOFRAN) 4 MG tablet Take 4 mg by mouth every 6 (six) hours.    [provider]  oxybutynin (DITROPAN) 5 MG tablet Take 5 mg by mouth 2 (two) times daily. 01/01/19   [provider]  rivaroxaban (XARELTO) 20 MG TABS tablet Take 20 mg by mouth daily.    [provider]  Zoster Vaccine Live, PF, (ZOSTAVAX) 02725 UNT/0.65ML injection Zostavax (PF) 19,400 unit/0.65 mL subcutaneous suspension    [provider]    Family History  Problem Relation Age of Onset  . Breast cancer Neg Hx      Social History    Tobacco Use  . Smoking status: Current Every Day Smoker    Packs/day: 0.25    Types: Cigarettes  . Smokeless tobacco: Never Used  . Tobacco comment: 3 cigarettes per day   Substance Use Topics  . Alcohol use: No  . Drug use: No    Allergies as of 10/01/2019 - Review Complete 10/01/2019  Allergen Reaction Noted  . Ibuprofen Hives 09/20/2016  . Nsaids Hives 05/06/2017    Review of Systems:    All systems reviewed and negative except where noted in HPI.   Physical Exam:  BP 95/62   Pulse 69   Temp 98.3 F (36.8 C)   Ht 5\' 7"  (1.702 m)   Wt 215 lb (97.5 kg)   BMI 33.67 kg/m  No LMP recorded. Patient has had a hysterectomy. Psych:  Alert  and cooperative. Normal mood and affect. General:   Alert,  Well-developed, well-nourished, pleasant and cooperative in NAD Head:  Normocephalic and atraumatic. Eyes:  Sclera clear, no icterus.   Conjunctiva pink. Ears:  Normal auditory acuity. Nose:  No deformity, discharge, or lesions. Mouth:  No deformity or lesions,oropharynx pink & moist. Neck:  Supple; no masses or thyromegaly. Lungs:  Respirations even and unlabored.  Clear throughout to auscultation.   No wheezes, crackles, or rhonchi. No acute distress. Heart:  Regular rate and rhythm; no murmurs, clicks, rubs, or gallops. Abdomen:  Normal bowel sounds.  No bruits.  Soft, non-tender and non-distended without masses, hepatosplenomegaly or hernias noted.  No guarding or rebound tenderness.    Neurologic:  Alert and oriented x3;  grossly normal neurologically. Skin:  Intact without significant lesions or rashes. No jaundice. Lymph Nodes:  No significant cervical adenopathy. Psych:  Alert and cooperative. Normal mood and affect.  Imaging Studies: No results found.  Assessment and Plan:   Jenny Hensley is a 68 y.o. y/o female has been referred for an incidentally found pancreatic lesion on a CT scan for work-up of hematuria.  The follow-up MRI shows a pancreatic lesion with  internal septations.  She does not have any abdominal pain or weight loss.  She is a smoker.  I will liaise with Dr. Rush Landmark at [facility] to determine if she will be willing to perform an EUS on her or would rather have her have a repeat MRI in 6 months.  She is also interested to proceed with her surveillance colonoscopy as her polyp which was 20 mm in size was taken out piecemeal in 2019.  I have discussed alternative options, risks & benefits,  which include, but are not limited to, bleeding, infection, perforation,respiratory complication & drug reaction.  The patient agrees with this plan & written consent will be obtained.     Follow up in 8 -12 weeks   Dr Jonathon Bellows MD,MRCP(U.K)

## 2019-10-03 ENCOUNTER — Other Ambulatory Visit: Payer: Self-pay

## 2019-10-03 DIAGNOSIS — K862 Cyst of pancreas: Secondary | ICD-10-CM

## 2019-10-07 ENCOUNTER — Ambulatory Visit: Payer: Medicare Other | Admitting: Urology

## 2019-10-14 ENCOUNTER — Telehealth: Payer: Self-pay | Admitting: Gastroenterology

## 2019-10-14 ENCOUNTER — Other Ambulatory Visit: Payer: Self-pay

## 2019-10-14 DIAGNOSIS — Z86010 Personal history of colonic polyps: Secondary | ICD-10-CM

## 2019-10-14 MED ORDER — NA SULFATE-K SULFATE-MG SULF 17.5-3.13-1.6 GM/177ML PO SOLN: 1.0000 | Freq: Once | ORAL | 0 refills | Status: AC

## 2019-10-14 NOTE — Telephone Encounter (Signed)
Pt left vm she statets Dr. Vicente Males had referred her to have an endoscopy in [facility] and she has not heard anything and it has been 2 weeks please call pt.

## 2019-10-14 NOTE — Telephone Encounter (Signed)
Spoke with pt and informed her that we have received the cardiac clearance and blood thinner holding instructions for the Xarelto. I explained to pt that her cardiologist gave instructions for pt to hold the Xarelto 7 days prior to colonoscopy and resume 1 day after. Pt understands and agrees. I also explained to pt that I have sent the pulmonary clearance request as Dr. Vicente Males feels it is better for pt to have an updated clearance since the last clearance was in January this year.

## 2019-10-14 NOTE — Telephone Encounter (Signed)
Spoke with pt and informed her of Dr. Vicente Males and Dr. Eugenia Pancoast plan to hold off on the EUS and plan for a repeat MRI instead. Pt agrees. I explained that we will contact her to schedule the repeat MRI about 1 month before it's due. Pt agrees.

## 2019-10-16 ENCOUNTER — Telehealth: Payer: Self-pay

## 2019-10-16 NOTE — Telephone Encounter (Signed)
Received paper work from [facility] requesting Pulmonary Clearance for a colonoscopy scheduled 10/21/19. I made her aware since her last visit was in January with Dr. Alva Garnet she will need to be set up with a different provider and this will require in office visit. Appt made. Nothing further needed at this time.

## 2019-10-17 ENCOUNTER — Other Ambulatory Visit
Admission: RE | Admit: 2019-10-17 | Discharge: 2019-10-17 | Disposition: A | Discharge status: Home / Self Care | Payer: Medicare Other | Source: Ambulatory Visit | Attending: Gastroenterology | Admitting: Gastroenterology

## 2019-10-17 ENCOUNTER — Other Ambulatory Visit: Payer: Self-pay

## 2019-10-17 ENCOUNTER — Ambulatory Visit: Payer: Medicare Other | Admitting: Pulmonary Disease

## 2019-10-17 ENCOUNTER — Encounter: Payer: Self-pay | Admitting: Pulmonary Disease

## 2019-10-17 VITALS — BP 120/72 | HR 95 | Temp 97.6°F | Ht 67.0 in | Wt 218.4 lb

## 2019-10-17 DIAGNOSIS — F17200 Nicotine dependence, unspecified, uncomplicated: Secondary | ICD-10-CM

## 2019-10-17 DIAGNOSIS — J309 Allergic rhinitis, unspecified: Secondary | ICD-10-CM | POA: Diagnosis not present

## 2019-10-17 DIAGNOSIS — Z01812 Encounter for preprocedural laboratory examination: Secondary | ICD-10-CM | POA: Insufficient documentation

## 2019-10-17 DIAGNOSIS — Z20828 Contact with and (suspected) exposure to other viral communicable diseases: Secondary | ICD-10-CM | POA: Diagnosis not present

## 2019-10-17 DIAGNOSIS — E669 Obesity, unspecified: Secondary | ICD-10-CM

## 2019-10-17 DIAGNOSIS — G4733 Obstructive sleep apnea (adult) (pediatric): Secondary | ICD-10-CM | POA: Diagnosis not present

## 2019-10-17 DIAGNOSIS — Z01811 Encounter for preprocedural respiratory examination: Secondary | ICD-10-CM

## 2019-10-17 LAB — SARS CORONAVIRUS 2 (TAT 6-24 HRS): SARS Coronavirus 2: NEGATIVE

## 2019-10-17 NOTE — Patient Instructions (Signed)
You can follow up here as needed.  

## 2019-10-17 NOTE — Progress Notes (Signed)
Subjective:    Patient ID: Jenny Hensley, female    DOB: [birth year], 68 y.o.   MRN: [id number]  HPI  Patient is a 68 year old current light smoker (3 cigarettes/day) kindly referred by Dr. Vicente Males for preprocedure pulmonary evaluation.  The patient has a history of mild to moderate obstructive sleep apnea and prior history of colonoscopy having to be aborted due to apnea and hypoxemia during the procedure.  This was in May 2019.  The patient was evaluated by Dr. Merton Border in January 2020 for preoperative evaluation for repeat colonoscopy, please refer to that note for details.  Because of COVID-19 her repeat colonoscopy was postponed.  She is now wishing to proceed with the same.  She is supposed to have the procedure on November 2.  She does not describe any issues with exertional dyspnea.  She does not have issues with chronic cough though she has occasional cough related to post nasal drainage.  She does not describe any hemoptysis, chest pain, orthopnea or paroxysmal nocturnal dyspnea.  No lower extremity edema.  She does not use any inhalers.  Does not describe any wheezing or any other pulmonary problems.  She is limited on ambulation due to chronic back pain and lower extremity pain.  As noted she has not been able to use her CPAP reliably so she has discontinued this therapy.  Her obstructive sleep apnea is being managed by ENT.  She had started immunotherapy for allergies however she discontinued this as well.  She does not do nasal hygiene consistently.  She has not had pulmonary function testing nor spirometry however has had no symptoms to lead to procuring these tests.  I have reviewed her past medical history, surgical history, family history and is as noted.  I have also reviewed Dr. Alva Garnet prior note.  Chest x-ray of 13 January 2019 was normal.  DATA/EVENTS: 06/12/18 PSG: Mild to moderate OSA with a total AHI of 6.4/h, worst during REM sleep, mostly involving hypopneas.  Sleep  efficiency was poor and most arousals were spontaneous.  Dr. Richardson Landry 06/29/18 CPAP titration: recommended CPAP 11 cm H2O - Dr.Bennett 01/13/19 chest x-ray without gross abnormalities 10/17/19 not using CPAP, states cannot tolerate.  Review of Systems  Constitutional: Negative.   HENT: Positive for congestion and postnasal drip.   Eyes: Negative.   Respiratory: Positive for apnea (OSA by history). Negative for choking, shortness of breath and wheezing.   Cardiovascular: Negative.   Gastrointestinal: Negative.   Endocrine: Negative.   Genitourinary: Negative.   Musculoskeletal: Negative.   Skin: Negative.   Allergic/Immunologic: Negative.   Neurological: Negative.   Hematological: Negative.   Psychiatric/Behavioral: Negative.   All other systems reviewed and are negative.      Objective:   Physical Exam Vitals signs and nursing note reviewed.  Constitutional:      General: She is not in acute distress.    Appearance: She is obese. She is not ill-appearing.  HENT:     Head: Normocephalic and atraumatic.     Right Ear: External ear normal.     Left Ear: External ear normal.     Nose:     Comments: Nose/mouth/throat not examined due to masking requirements for COVID 19. Eyes:     General: No scleral icterus.    Conjunctiva/sclera: Conjunctivae normal.     Pupils: Pupils are equal, round, and reactive to light.  Neck:     Musculoskeletal: Neck supple.     Thyroid: No thyromegaly.  Vascular: No JVD.     Trachea: Trachea and phonation normal.  Cardiovascular:     Rate and Rhythm: Normal rate and regular rhythm.     Pulses: Normal pulses.     Heart sounds: Normal heart sounds.  Pulmonary:     Effort: Pulmonary effort is normal.     Breath sounds: Normal breath sounds.  Abdominal:     General: Abdomen is protuberant. There is no distension.  Musculoskeletal: Normal range of motion.     Right lower leg: No edema.     Left lower leg: No edema.  Lymphadenopathy:      Cervical: No cervical adenopathy.  Skin:    General: Skin is warm and dry.  Neurological:     General: No focal deficit present.     Mental Status: She is alert and oriented to person, place, and time.  Psychiatric:        Mood and Affect: Mood normal.        Behavior: Behavior normal.        Assessment & Plan:   1.  Obstructive sleep apnea: This was classified as mild by polysomnogram however it was noted particularly severe in supine position.  Avoid supine position during the procedure.  Colonoscopy should be managed with cautious sedation titration.  Propofol can be given if infused very slowly without major respiratory depression.  Would avoid opiates if at all possible.  Another potential agent that could be used is Precedex.  This should be discussed with anesthesia.  May consider also having the patient use BiPAP during the procedure or have BiPAP on standby.  Anesthesiology should be made aware of her issues so that sedation can be adjusted accordingly.  The only other solution would be to have the patient undergo colonoscopy while intubated but this would have to be arranged with anesthesia.  2.  Perennial allergic rhinitis: Recommend to resume nasal hygiene as previously recommended by ENT.  3.  Smoker: No clinical evidence of COPD.  Light smoker of only 3 cigarettes/day.  4.  Obesity: This issue adds complexity to her management.  5.  Preoperative pulmonary/respiratory exam: See recommendations as above.   Patient will return to the clinic on an as-needed basis.   This chart was dictated using voice recognition software/Dragon.  Despite best efforts to proofread, errors can occur which can change the meaning.  Any change was purely unintentional.

## 2019-10-21 ENCOUNTER — Encounter: Payer: Self-pay | Admitting: *Deleted

## 2019-10-21 ENCOUNTER — Ambulatory Visit
Admission: RE | Admit: 2019-10-21 | Discharge: 2019-10-21 | Disposition: A | Discharge status: Home / Self Care | Payer: Medicare Other | Attending: Gastroenterology | Admitting: Gastroenterology

## 2019-10-21 ENCOUNTER — Other Ambulatory Visit: Payer: Self-pay

## 2019-10-21 ENCOUNTER — Ambulatory Visit: Payer: Medicare Other | Admitting: Certified Registered Nurse Anesthetist

## 2019-10-21 ENCOUNTER — Encounter
Admission: RE | Disposition: A | Discharge status: Home / Self Care | Payer: Self-pay | Source: Home / Self Care | Attending: Gastroenterology

## 2019-10-21 DIAGNOSIS — Z1211 Encounter for screening for malignant neoplasm of colon: Secondary | ICD-10-CM | POA: Diagnosis not present

## 2019-10-21 DIAGNOSIS — F17210 Nicotine dependence, cigarettes, uncomplicated: Secondary | ICD-10-CM | POA: Diagnosis not present

## 2019-10-21 DIAGNOSIS — Z7901 Long term (current) use of anticoagulants: Secondary | ICD-10-CM | POA: Insufficient documentation

## 2019-10-21 DIAGNOSIS — F329 Major depressive disorder, single episode, unspecified: Secondary | ICD-10-CM | POA: Diagnosis not present

## 2019-10-21 DIAGNOSIS — Z86010 Personal history of colonic polyps: Secondary | ICD-10-CM | POA: Diagnosis not present

## 2019-10-21 DIAGNOSIS — Z955 Presence of coronary angioplasty implant and graft: Secondary | ICD-10-CM | POA: Diagnosis not present

## 2019-10-21 DIAGNOSIS — Z79899 Other long term (current) drug therapy: Secondary | ICD-10-CM | POA: Insufficient documentation

## 2019-10-21 DIAGNOSIS — D122 Benign neoplasm of ascending colon: Secondary | ICD-10-CM | POA: Insufficient documentation

## 2019-10-21 DIAGNOSIS — D120 Benign neoplasm of cecum: Secondary | ICD-10-CM | POA: Insufficient documentation

## 2019-10-21 DIAGNOSIS — Z794 Long term (current) use of insulin: Secondary | ICD-10-CM | POA: Diagnosis not present

## 2019-10-21 DIAGNOSIS — I2510 Atherosclerotic heart disease of native coronary artery without angina pectoris: Secondary | ICD-10-CM | POA: Diagnosis not present

## 2019-10-21 DIAGNOSIS — Z7982 Long term (current) use of aspirin: Secondary | ICD-10-CM | POA: Insufficient documentation

## 2019-10-21 DIAGNOSIS — I4891 Unspecified atrial fibrillation: Secondary | ICD-10-CM | POA: Diagnosis not present

## 2019-10-21 DIAGNOSIS — K635 Polyp of colon: Secondary | ICD-10-CM | POA: Diagnosis not present

## 2019-10-21 DIAGNOSIS — I110 Hypertensive heart disease with heart failure: Secondary | ICD-10-CM | POA: Diagnosis not present

## 2019-10-21 DIAGNOSIS — I509 Heart failure, unspecified: Secondary | ICD-10-CM | POA: Diagnosis not present

## 2019-10-21 DIAGNOSIS — E119 Type 2 diabetes mellitus without complications: Secondary | ICD-10-CM | POA: Insufficient documentation

## 2019-10-21 HISTORY — PX: COLONOSCOPY WITH PROPOFOL: SHX5780

## 2019-10-21 HISTORY — DX: Cyst of pancreas: K86.2

## 2019-10-21 LAB — GLUCOSE, CAPILLARY: Glucose-Capillary: 258 mg/dL — ABNORMAL HIGH (ref 70–99)

## 2019-10-21 SURGERY — COLONOSCOPY WITH PROPOFOL
Anesthesia: General

## 2019-10-21 MED ORDER — PROPOFOL 10 MG/ML IV BOLUS
INTRAVENOUS | Status: DC | PRN
  Administered 2019-10-21: 70 mg via INTRAVENOUS
  Administered 2019-10-21: 30 mg via INTRAVENOUS

## 2019-10-21 MED ORDER — SODIUM CHLORIDE 0.9 % IV SOLN
INTRAVENOUS | Status: DC
  Administered 2019-10-21: 09:00:00 via INTRAVENOUS

## 2019-10-21 MED ORDER — PROPOFOL 500 MG/50ML IV EMUL
INTRAVENOUS | Status: DC | PRN
  Administered 2019-10-21: 160 ug/kg/min via INTRAVENOUS

## 2019-10-21 MED ORDER — METOPROLOL TARTRATE 5 MG/5ML IV SOLN
INTRAVENOUS | Status: DC | PRN
  Administered 2019-10-21 (×2): 1 mg via INTRAVENOUS

## 2019-10-21 MED ORDER — METOPROLOL TARTRATE 5 MG/5ML IV SOLN
INTRAVENOUS | Status: AC
  Filled 2019-10-21: qty 5

## 2019-10-21 MED ORDER — PHENYLEPHRINE HCL (PRESSORS) 10 MG/ML IV SOLN
INTRAVENOUS | Status: DC | PRN
  Administered 2019-10-21 (×2): 100 ug via INTRAVENOUS

## 2019-10-21 NOTE — Anesthesia Procedure Notes (Signed)
Performed by: Danett Palazzo, CRNA Pre-anesthesia Checklist: Patient identified, Emergency Drugs available, Suction available, Patient being monitored and Timeout performed Patient Re-evaluated:Patient Re-evaluated prior to induction Oxygen Delivery Method: Supernova nasal CPAP Induction Type: IV induction       

## 2019-10-21 NOTE — Transfer of Care (Signed)
Immediate Anesthesia Transfer of Care Note  Patient: Jenny Hensley  Procedure(s) Performed: COLONOSCOPY WITH PROPOFOL (N/A )  Patient Location: PACU  Anesthesia Type:General  Level of Consciousness: awake and alert   Airway & Oxygen Therapy: Patient Spontanous Breathing  Post-op Assessment: Report given to RN and Post -op Vital signs reviewed and stable  Post vital signs: Reviewed and stable  Last Vitals:  Vitals Value Taken Time  BP 124/72 10/21/19 0956  Temp 35.8 C 10/21/19 0956  Pulse 72 10/21/19 0957  Resp 19 10/21/19 0957  SpO2 97 % 10/21/19 0957  Vitals shown include unvalidated device data.  Last Pain:  Vitals:   10/21/19 0956  TempSrc: Tympanic  PainSc: 0-No pain         Complications: No apparent anesthesia complications

## 2019-10-21 NOTE — Anesthesia Preprocedure Evaluation (Addendum)
Anesthesia Evaluation  Patient identified by MRN, date of birth, ID band Patient awake    Reviewed: Allergy & Precautions, H&P , NPO status , Patient's Chart, lab work & pertinent test results  History of Anesthesia Complications (+) PONV and history of anesthetic complications  Airway Mallampati: III  TM Distance: <3 FB     Dental  (+) Chipped   Pulmonary neg COPD, Current Smoker,           Cardiovascular hypertension, + CAD and +CHF  + dysrhythmias Atrial Fibrillation      Neuro/Psych PSYCHIATRIC DISORDERS Depression negative neurological ROS     GI/Hepatic Neg liver ROS, GERD  Controlled,  Endo/Other  diabetes  Renal/GU negative Renal ROS  negative genitourinary   Musculoskeletal   Abdominal   Peds  Hematology negative hematology ROS (+)   Anesthesia Other Findings Past Medical History: No date: A-fib ([place]) No date: CHF (congestive heart failure) ([facility]) No date: Coronary artery disease No date: Depression No date: Diabetes mellitus without complication ([facility]) No date: Environmental allergies No date: Heart chamber dysfunction No date: Hypertension No date: PONV (postoperative nausea and vomiting)  Past Surgical History: No date: ABDOMINAL HYSTERECTOMY No date: APPENDECTOMY No date: CHOLECYSTECTOMY No date: COLON SURGERY 04/24/2018: COLONOSCOPY WITH PROPOFOL; N/A     Comment:  Procedure: COLONOSCOPY WITH PROPOFOL;  Surgeon: Jonathon Bellows, MD;  Location: [facility] ENDOSCOPY;  Service:               Gastroenterology;  Laterality: N/A; No date: CORONARY ANGIOPLASTY WITH STENT PLACEMENT 2003: JOINT REPLACEMENT; Left     Comment:  knee No date: KNEE SURGERY No date: NASAL SINUS SURGERY No date: NECK SURGERY 01/18/2018: SHOULDER ARTHROSCOPY WITH OPEN ROTATOR CUFF REPAIR; Left     Comment:  Procedure: SHOULDER ARTHROSCOPY WITH OPEN ROTATOR CUFF               REPAIR,SUBACROMINAL DECOMPRESSION, DISTAL  CLAVICLE               EXCISION,BICEPS TENOTOMY;  Surgeon: Thornton Park, MD;              Location: [facility] ORS;  Service: Orthopedics;  Laterality:               Left; No date: SMALL INTESTINE SURGERY     Reproductive/Obstetrics negative OB ROS                            Anesthesia Physical Anesthesia Plan  ASA: III  Anesthesia Plan: General   Post-op Pain Management:    Induction:   PONV Risk Score and Plan: Propofol infusion and TIVA  Airway Management Planned: Nasal CPAP  Additional Equipment:   Intra-op Plan:   Post-operative Plan:   Informed Consent: I have reviewed the patients History and Physical, chart, labs and discussed the procedure including the risks, benefits and alternatives for the proposed anesthesia with the patient or authorized representative who has indicated his/her understanding and acceptance.     Dental Advisory Given  Plan Discussed with: Anesthesiologist, CRNA and Surgeon  Anesthesia Plan Comments:        Anesthesia Quick Evaluation

## 2019-10-21 NOTE — Anesthesia Postprocedure Evaluation (Signed)
Anesthesia Post Note  Patient: Jenny Hensley  Procedure(s) Performed: COLONOSCOPY WITH PROPOFOL (N/A )  Patient location during evaluation: PACU Anesthesia Type: General Level of consciousness: awake and alert Pain management: pain level controlled Vital Signs Assessment: post-procedure vital signs reviewed and stable Respiratory status: spontaneous breathing, nonlabored ventilation and respiratory function stable Cardiovascular status: blood pressure returned to baseline and stable Postop Assessment: no apparent nausea or vomiting Anesthetic complications: no     Last Vitals:  Vitals:   10/21/19 0956 10/21/19 1006  BP: 124/72 128/65  Pulse: (!) 101 100  Resp: (!) 22 18  Temp: (!) 35.8 C   SpO2: 98% 97%    Last Pain:  Vitals:   10/21/19 1006  TempSrc:   PainSc: 0-No pain                 Durenda Hurt

## 2019-10-21 NOTE — Anesthesia Post-op Follow-up Note (Signed)
Anesthesia QCDR form completed.        

## 2019-10-21 NOTE — H&P (Signed)
Jonathon Bellows, MD [address], [place], [place], [place], [address] [address], [place], [place], [place], [address] Phone: [phone]  Fax: [phone]  Primary Care Physician:  Marguerita Merles, MD   Pre-Procedure History & Physical: HPI:  Jenny Hensley is a 68 y.o. female is here for an colonoscopy.   Past Medical History:  Diagnosis Date  . A-fib ([place])   . CHF (congestive heart failure) ([place])   . Coronary artery disease   . Cyst of pancreas   . Depression   . Diabetes mellitus without complication ([place])   . Environmental allergies   . Heart chamber dysfunction   . Hypertension   . PONV (postoperative nausea and vomiting)     Past Surgical History:  Procedure Laterality Date  . ABDOMINAL HYSTERECTOMY    . APPENDECTOMY    . CHOLECYSTECTOMY    . COLON SURGERY    . COLONOSCOPY WITH PROPOFOL N/A 04/24/2018   Procedure: COLONOSCOPY WITH PROPOFOL;  Surgeon: Jonathon Bellows, MD;  Location: [facility] ENDOSCOPY;  Service: Gastroenterology;  Laterality: N/A;  . CORONARY ANGIOPLASTY WITH STENT PLACEMENT    . JOINT REPLACEMENT Left 2003   knee  . KNEE SURGERY    . NASAL SINUS SURGERY    . NECK SURGERY    . SHOULDER ARTHROSCOPY WITH OPEN ROTATOR CUFF REPAIR Left 01/18/2018   Procedure: SHOULDER ARTHROSCOPY WITH OPEN ROTATOR CUFF REPAIR,SUBACROMINAL DECOMPRESSION, DISTAL CLAVICLE EXCISION,BICEPS TENOTOMY;  Surgeon: Thornton Park, MD;  Location: [facility] ORS;  Service: Orthopedics;  Laterality: Left;  . SMALL INTESTINE SURGERY      Prior to Admission medications   Medication Sig Start Date End Date Taking? Authorizing Provider  aspirin EC 81 MG tablet Take 81 mg by mouth daily.   Yes [provider]  ibuprofen (ADVIL,MOTRIN) 200 MG tablet Take 400 mg by mouth 2 (two) times daily as needed (FOR PAIN.).   Yes [provider]  Insulin Detemir (LEVEMIR FLEXTOUCH) 100 UNIT/ML Pen Inject 64 Units into the skin 2 (two) times daily.    Yes [provider]  insulin  lispro (HUMALOG KWIKPEN) 100 UNIT/ML KiwkPen Inject 20-40 Units into the skin 5 (five) times daily. SLIDING SCALE INSULIN   Yes [provider]  lisinopril-hydrochlorothiazide (PRINZIDE,ZESTORETIC) 10-12.5 MG tablet Take 1 tablet by mouth daily.   Yes [provider]  metFORMIN (GLUCOPHAGE) 500 MG tablet  06/25/19  Yes [provider]  omeprazole (PRILOSEC) 40 MG capsule Take 40 mg by mouth daily at 6 (six) AM.   Yes [provider]  acetaminophen (TYLENOL 8 HOUR ARTHRITIS PAIN) 650 MG CR tablet Take 1,950 mg by mouth 2 (two) times daily as needed for pain.    [provider]  buPROPion (WELLBUTRIN XL) 150 MG 24 hr tablet Take 150 mg by mouth daily.     [provider]  cetirizine (ZYRTEC ALLERGY) 10 MG tablet Take 1 tablet (10 mg total) by mouth daily. Patient not taking: Reported on 10/21/2019 12/24/18   Wilhelmina Mcardle, MD  Dulaglutide (TRULICITY) 9.16 BW/4.6KZ SOPN Trulicity 9.93 TT/0.1 mL subcutaneous pen injector    [provider]  ezetimibe (ZETIA) 10 MG tablet Take 10 mg by mouth daily.    [provider]  fluticasone (FLONASE) 50 MCG/ACT nasal spray Place 1-2 sprays into both nostrils daily as needed for allergies or rhinitis.    [provider]  glyBURIDE (DIABETA) 5 MG tablet Take 10 mg by mouth 2 (two) times daily.    [provider]  ketoconazole (NIZORAL) 2 % cream ketoconazole 2 % topical cream    [provider]  lactulose (CHRONULAC) 10 GM/15ML solution  04/09/19   [provider]  linagliptin (TRADJENTA) 5 MG TABS tablet Take 5 mg by mouth daily.    [provider]  metoprolol tartrate (LOPRESSOR) 25 MG tablet Take 1 tablet (25 mg total) by mouth 2 (two) times daily. 01/16/19 03/17/19  Henreitta Leber, MD  mometasone (ELOCON) 0.1 % lotion mometasone 0.1 % topical solution    [provider]  Multiple Vitamins-Minerals (CENTRUM SILVER 50+WOMEN) TABS Take 1 tablet  by mouth daily at 6 (six) AM.    [provider]  ondansetron (ZOFRAN) 4 MG tablet Take 4 mg by mouth every 6 (six) hours.    [provider]  oxybutynin (DITROPAN) 5 MG tablet Take 5 mg by mouth 2 (two) times daily. 01/01/19   [provider]  rivaroxaban (XARELTO) 20 MG TABS tablet Take 20 mg by mouth daily.    [provider]  SUPREP BOWEL PREP KIT 17.5-3.13-1.6 GM/177ML SOLN  10/14/19   [provider]  Zoster Vaccine Live, PF, (ZOSTAVAX) 75102 UNT/0.65ML injection Zostavax (PF) 19,400 unit/0.65 mL subcutaneous suspension    [provider]    Allergies as of 10/14/2019 - Review Complete 10/01/2019  Allergen Reaction Noted  . Ibuprofen Hives 09/20/2016  . Nsaids Hives 05/06/2017    Family History  Problem Relation Age of Onset  . Breast cancer Neg Hx     Social History   Socioeconomic History  . Marital status: Single    Spouse name: Not on file  . Number of children: Not on file  . Years of education: Not on file  . Highest education level: Not on file  Occupational History  . Not on file  Social Needs  . Financial resource strain: Not on file  . Food insecurity    Worry: Not on file    Inability: Not on file  . Transportation needs    Medical: Not on file    Non-medical: Not on file  Tobacco Use  . Smoking status: Current Every Day Smoker    Packs/day: 0.25    Types: Cigarettes  . Smokeless tobacco: Never Used  . Tobacco comment: 3 cigarettes per day   Substance and Sexual Activity  . Alcohol use: No  . Drug use: No  . Sexual activity: Not on file  Lifestyle  . Physical activity    Days per week: Not on file    Minutes per session: Not on file  . Stress: Not on file  Relationships  . Social Herbalist on phone: Not on file    Gets together: Not on file    Attends religious service: Not on file    Active member of club or organization: Not on file    Attends meetings of clubs or  organizations: Not on file    Relationship status: Not on file  . Intimate partner violence    Fear of current or ex partner: Not on file    Emotionally abused: Not on file    Physically abused: Not on file    Forced sexual activity: Not on file  Other Topics Concern  . Not on file  Social History Narrative  . Not on file    Review of Systems: See HPI, otherwise negative ROS  Physical Exam: BP (!) 139/98   Pulse 62   Temp (!) 96.5 F (35.8 C) (Tympanic)  Resp 18   Ht '5\' 7"'  (1.702 m)   Wt 99.8 kg   SpO2 98%   BMI 34.46 kg/m  General:   Alert,  pleasant and cooperative in NAD Head:  Normocephalic and atraumatic. Neck:  Supple; no masses or thyromegaly. Lungs:  Clear throughout to auscultation, normal respiratory effort.    Heart:  +S1, +S2, Regular rate and rhythm, No edema. Abdomen:  Soft, nontender and nondistended. Normal bowel sounds, without guarding, and without rebound.   Neurologic:  Alert and  oriented x4;  grossly normal neurologically.  Impression/Plan: Jenny Hensley is here for an colonoscopy to be performed for surveillance due to prior history of colon polyps   Risks, benefits, limitations, and alternatives regarding  colonoscopy have been reviewed with the patient.  Questions have been answered.  All parties agreeable.   Jonathon Bellows, MD  10/21/2019, 9:14 AM

## 2019-10-21 NOTE — Op Note (Signed)
[facility] Gastroenterology Patient Name: Jenny Hensley Procedure Date: 10/21/2019 9:17 AM MRN: [id number] Account #: [id number] Date of Birth: 04-Jun-1951 Admit Type: Outpatient Age: 68 Room: [facility] ENDO ROOM 4 Gender: Female Note Status: Finalized Procedure:            Colonoscopy Indications:          High risk colon cancer surveillance: Personal history                        of colonic polyps Providers:            Jonathon Bellows MD, MD Referring MD:         Marguerita Merles, MD (Referring MD) Medicines:            Monitored Anesthesia Care Complications:        No immediate complications. Procedure:            Pre-Anesthesia Assessment:                       - Prior to the procedure, a History and Physical was                        performed, and patient medications, allergies and                        sensitivities were reviewed. The patient's tolerance of                        previous anesthesia was reviewed.                       - The risks and benefits of the procedure and the                        sedation options and risks were discussed with the                        patient. All questions were answered and informed                        consent was obtained.                       - After reviewing the risks and benefits, the patient                        was deemed in satisfactory condition to undergo the                        procedure.                       - After reviewing the risks and benefits, the patient                        was deemed in satisfactory condition to undergo the                        procedure.                       - ASA Grade  Assessment: II - A patient with mild                        systemic disease.                       After obtaining informed consent, the colonoscope was                        passed under direct vision. Throughout the procedure,                        the patient's blood pressure, pulse, and  oxygen                        saturations were monitored continuously. The                        Colonoscope was introduced through the anus and                        advanced to the the cecum, identified by the                        appendiceal orifice. The colonoscopy was performed with                        ease. The patient tolerated the procedure well. The                        quality of the bowel preparation was good. Findings:      The perianal and digital rectal examinations were normal.      Two sessile polyps were found in the descending colon. The polyps were 4       to 7 mm in size. These polyps were removed with a cold snare. Resection       and retrieval were complete.      Two sessile polyps were found in the ascending colon and cecum. The       polyps were 3 to 4 mm in size. These polyps were removed with a cold       biopsy forceps. Resection and retrieval were complete.      A 5 mm polyp was found in the cecum. The polyp was sessile. The polyp       was removed with a cold snare. Resection and retrieval were complete.      The exam was otherwise without abnormality on direct and retroflexion       views. Impression:           - Two 4 to 7 mm polyps in the descending colon, removed                        with a cold snare. Resected and retrieved.                       - Two 3 to 4 mm polyps in the ascending colon and in                        the cecum, removed with a cold biopsy forceps. Resected  and retrieved.                       - One 5 mm polyp in the cecum, removed with a cold                        snare. Resected and retrieved.                       - The examination was otherwise normal on direct and                        retroflexion views. Recommendation:       - Discharge patient to home (with escort).                       - Resume previous diet.                       - Continue present medications.                       -  Await pathology results.                       - Repeat colonoscopy in 3 years for surveillance. Procedure Code(s):    --- Professional ---                       [phone], Colonoscopy, flexible; with removal of tumor(s),                        polyp(s), or other lesion(s) by snare technique                       45380, 1, Colonoscopy, flexible; with biopsy, single                        or multiple Diagnosis Code(s):    --- Professional ---                       K63.5, Polyp of colon                       Z86.010, Personal history of colonic polyps CPT copyright 2019 [employer] Medical [employer]. All rights reserved. The codes documented in this report are preliminary and upon coder review may  be revised to meet current compliance requirements. Jonathon Bellows, MD Jonathon Bellows MD, MD 10/21/2019 9:54:11 AM This report has been signed electronically. Number of Addenda: 0 Note Initiated On: 10/21/2019 9:17 AM Scope Withdrawal Time: 0 hours 22 minutes 58 seconds  Total Procedure Duration: 0 hours 25 minutes 18 seconds  Estimated Blood Loss: Estimated blood loss: none.      [facility]

## 2019-10-22 ENCOUNTER — Encounter: Payer: Self-pay | Admitting: Gastroenterology

## 2019-10-22 LAB — SURGICAL PATHOLOGY

## 2019-10-29 ENCOUNTER — Encounter: Payer: Self-pay | Admitting: Gastroenterology

## 2019-12-11 IMAGING — DX DG ABDOMEN 1V
2 series · 2 of 2 positions shown · non-contrast
Comparison: None.

CLINICAL DATA: Abdominal distension

EXAM:
ABDOMEN - 1 VIEW

[abdomen supine (1 of 2)]
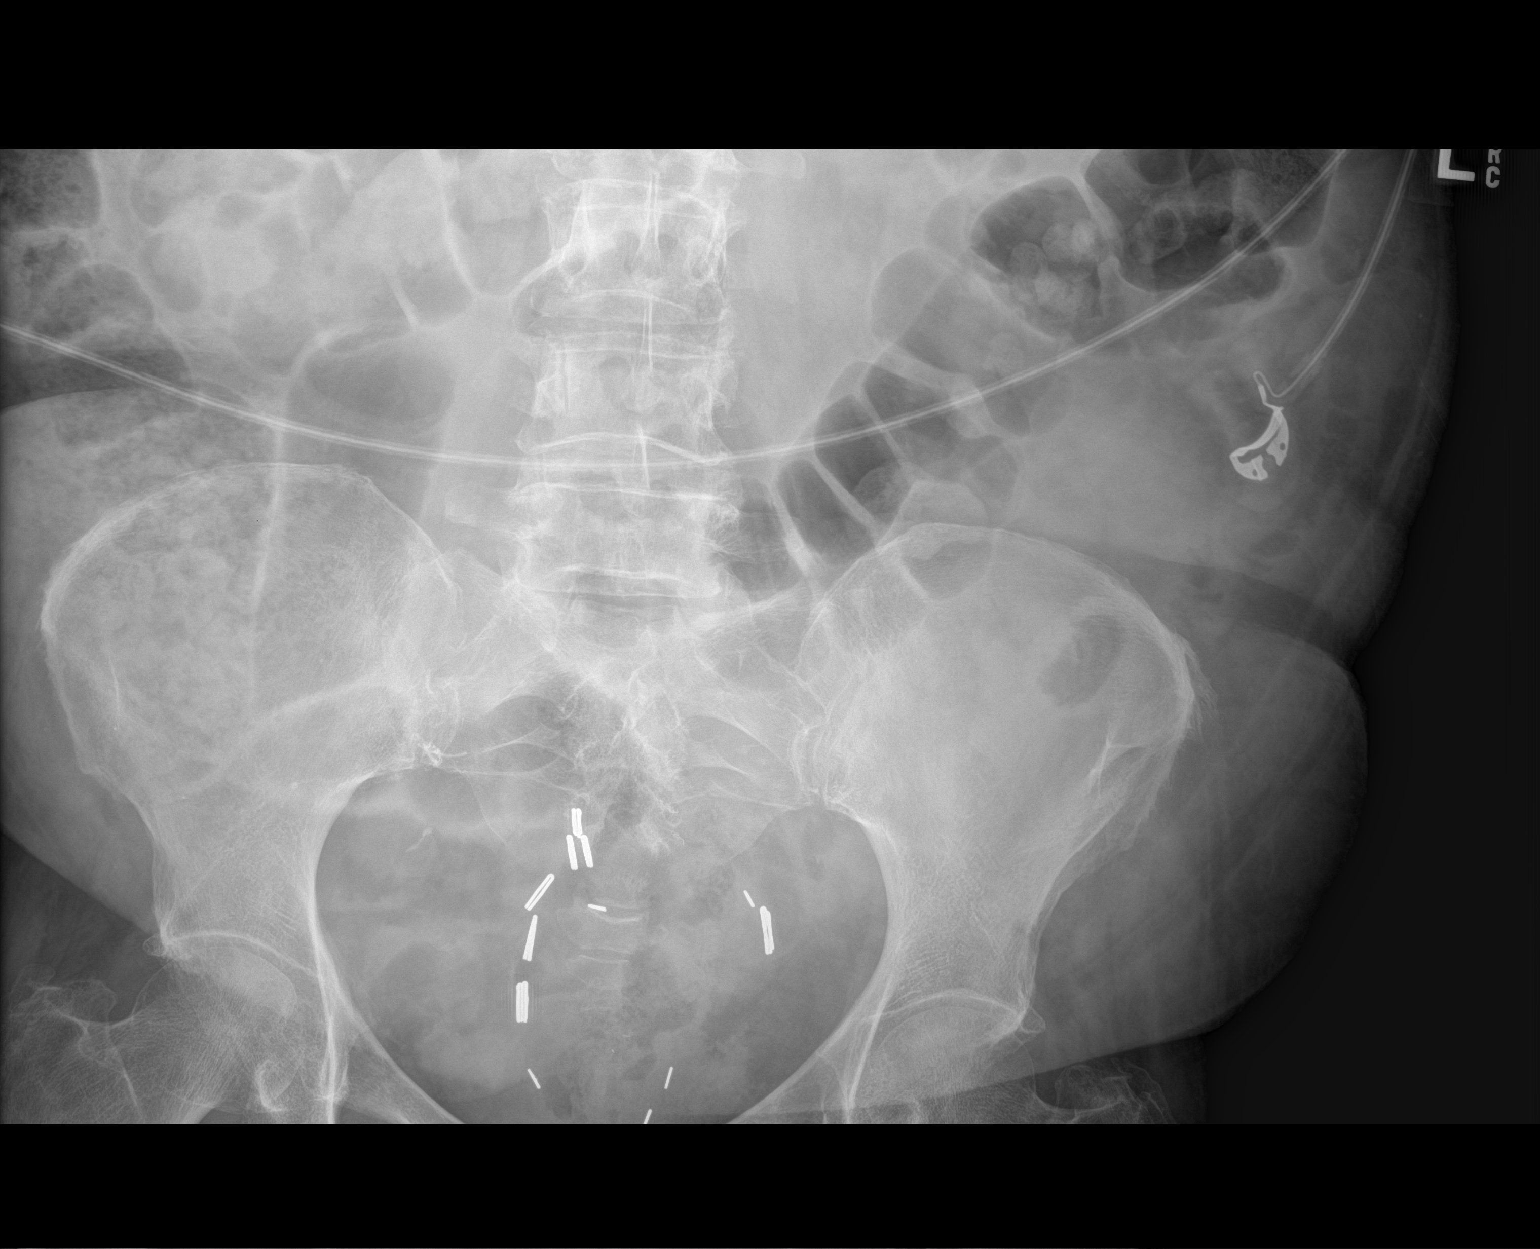

[abdomen supine (2 of 2)]
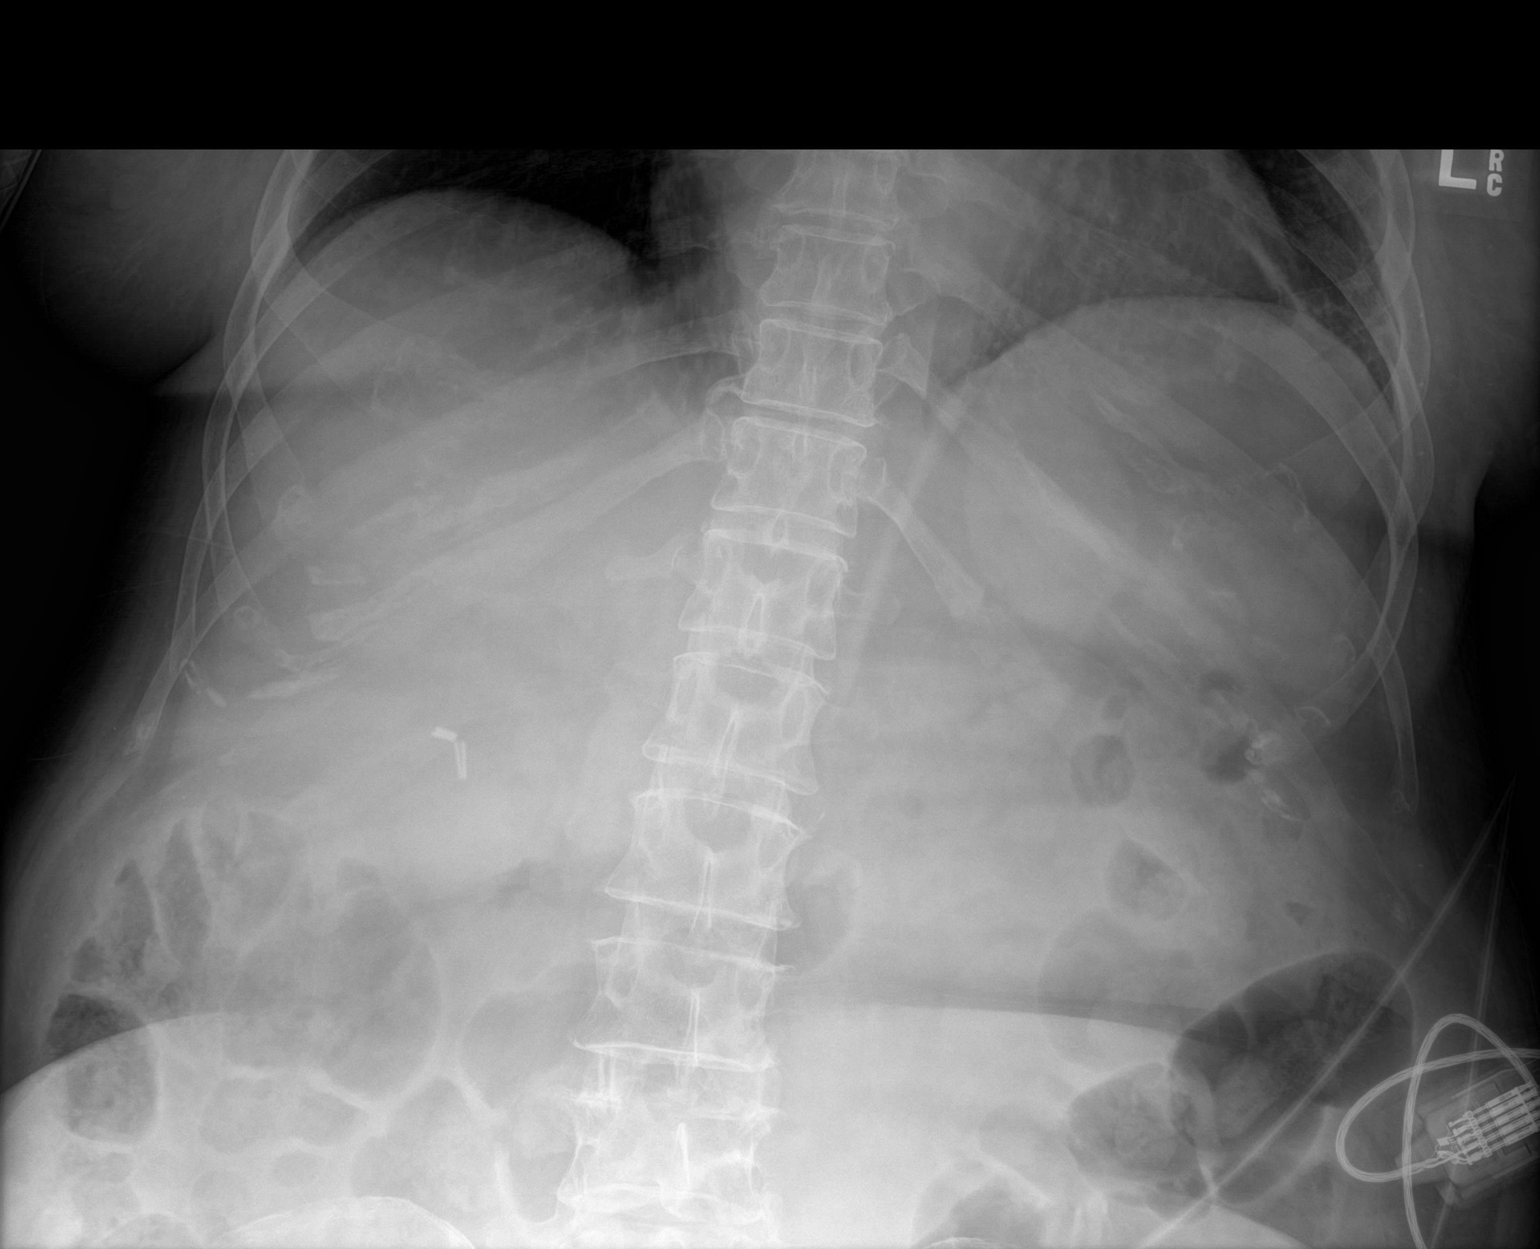

[2 of 2 positions shown; findings below may reference images not displayed]

FINDINGS: Scattered large and small bowel gas is noted. No obstructive changes
are noted. Mild retained fecal material is seen. Postoperative
changes are noted in the pelvis as well as in the right upper
quadrant. Degenerative changes of the lumbar spine are seen. No free
air is noted.
IMPRESSION: No acute abnormality noted.

## 2019-12-12 IMAGING — DX DG CHEST 1V PORT
1 series · 1 of 1 positions shown · non-contrast
Comparison: 01/12/2019

CLINICAL DATA: Shortness of breath.  Respiratory failure.

EXAM:
PORTABLE CHEST 1 VIEW

[chest ap]
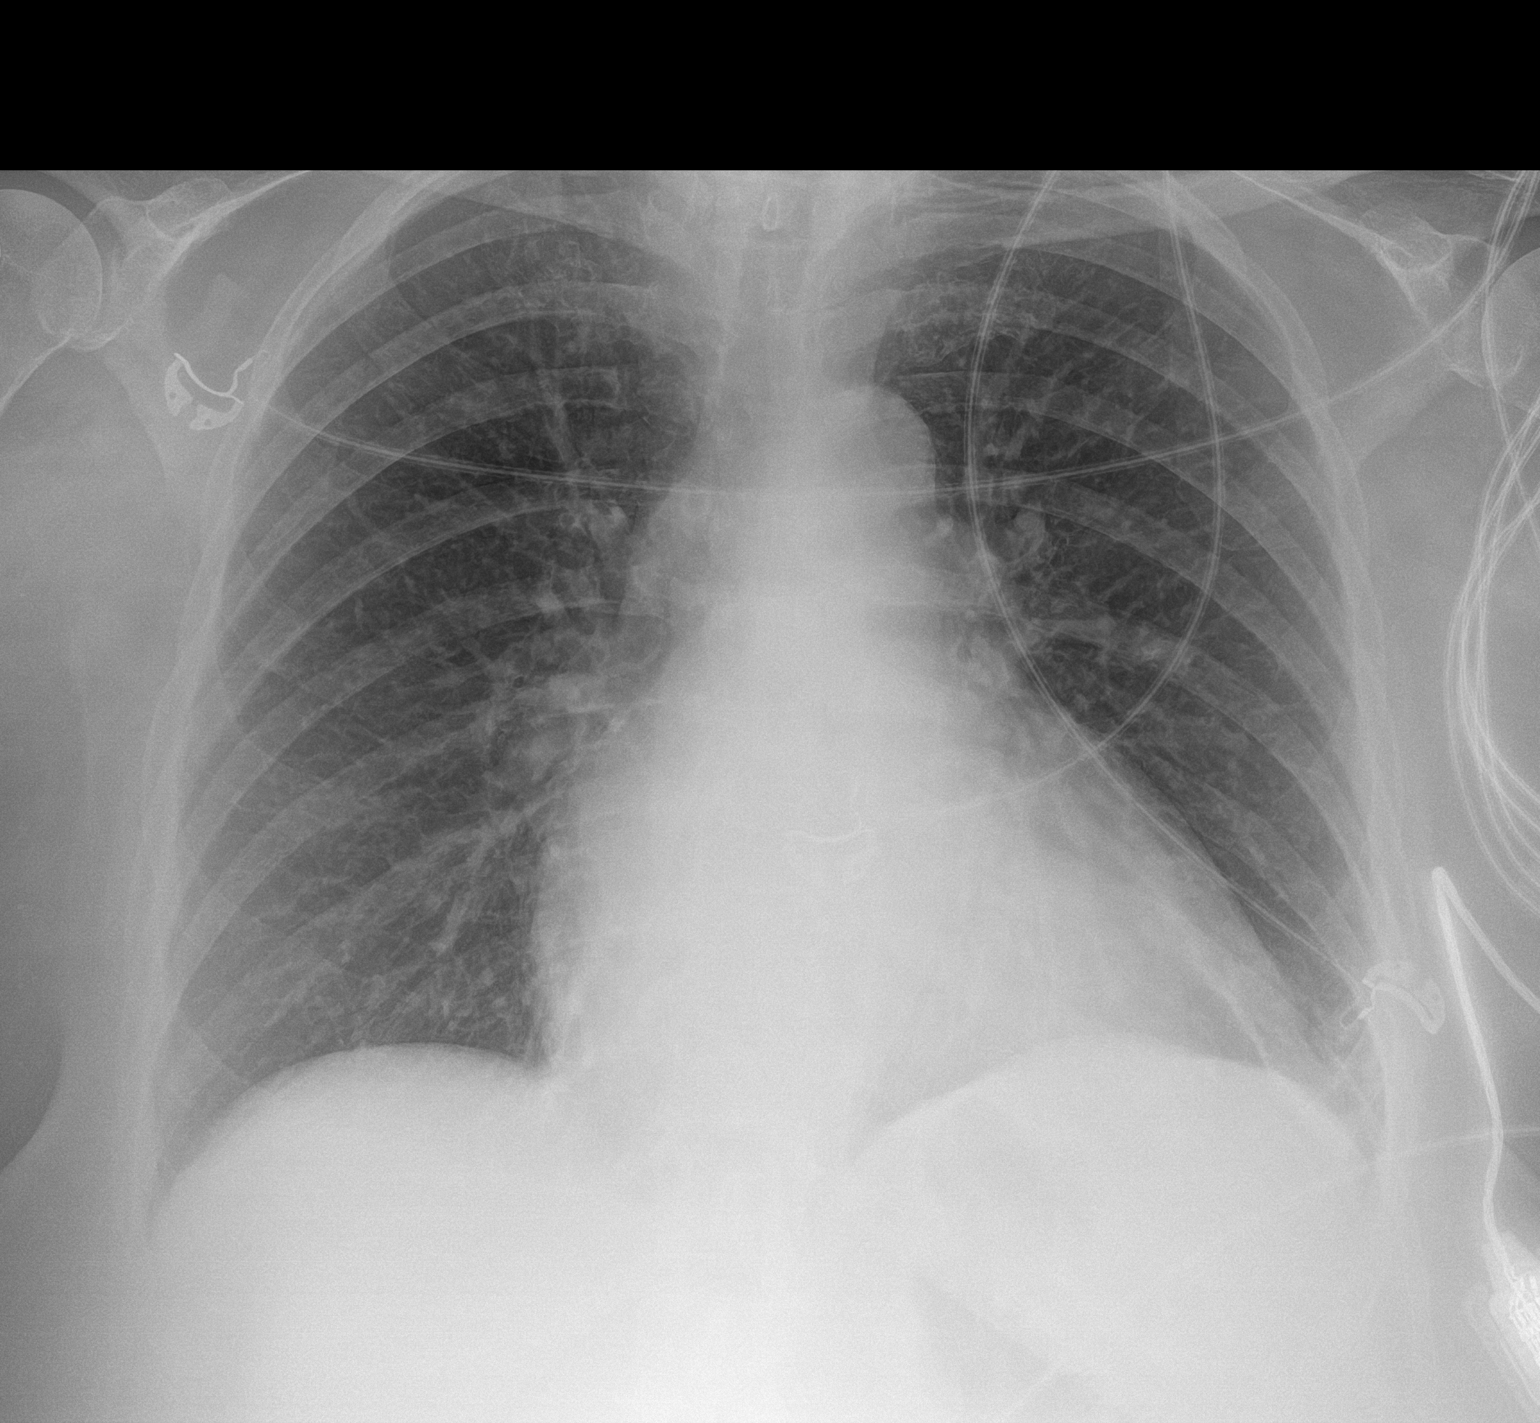

[1 of 1 positions shown; findings below may reference images not displayed]

FINDINGS: The heart size and mediastinal contours are within normal limits.
Both lungs are clear. The visualized skeletal structures are
unremarkable.
IMPRESSION: No active disease.

## 2019-12-30 ENCOUNTER — Telehealth: Payer: Self-pay

## 2019-12-30 NOTE — Telephone Encounter (Signed)
Pt left VM requesting a call about scheduling her repeat MRI/MRCP in March as recommended by Dr. Vicente Males. I returned pt's call but was unable to contact.  LVM to return call

## 2019-12-30 NOTE — Telephone Encounter (Signed)
Spoke with pt and was able to schedule MRI appointment (02-25-20 @ 9:00 am). Pt is aware of MRI appointment information.

## 2020-02-25 ENCOUNTER — Other Ambulatory Visit: Payer: Self-pay | Admitting: Gastroenterology

## 2020-02-25 ENCOUNTER — Other Ambulatory Visit: Payer: Self-pay

## 2020-02-25 ENCOUNTER — Ambulatory Visit
Admission: RE | Admit: 2020-02-25 | Discharge: 2020-02-25 | Disposition: A | Discharge status: Home / Self Care | Payer: Medicare PPO | Source: Ambulatory Visit | Attending: Gastroenterology | Admitting: Gastroenterology

## 2020-02-25 ENCOUNTER — Telehealth: Payer: Self-pay

## 2020-02-25 ENCOUNTER — Encounter: Payer: Self-pay | Admitting: Gastroenterology

## 2020-02-25 DIAGNOSIS — K862 Cyst of pancreas: Secondary | ICD-10-CM | POA: Diagnosis not present

## 2020-02-25 MED ORDER — GADOBUTROL 1 MMOL/ML IV SOLN
9.0000 mL | Freq: Once | INTRAVENOUS | Status: AC | PRN
  Administered 2020-02-25: 9 mL via INTRAVENOUS

## 2020-02-25 NOTE — Progress Notes (Signed)
Inform pancreatic cyst appears stable  Repeat abdominal MRI with and without IV gadolinium with MRCP is recommended in 6 months to ensure continued stability  Dr Jonathon Bellows MD,MRCP [facility]) Gastroenterology/Hepatology Pager: [phone]

## 2020-02-25 NOTE — Telephone Encounter (Signed)
Spoke with pt and informed her of MRI result and Dr. Georgeann Oppenheim recommendation. Pt understands and agrees.

## 2020-02-25 NOTE — Telephone Encounter (Signed)
-----   Message from Jonathon Bellows, MD sent at 02/25/2020 12:05 PM EST ----- Inform pancreatic cyst appears stable  Repeat abdominal MRI with and without IV gadolinium with MRCP is recommended in 6 months to ensure continued stability  Dr Jonathon Bellows MD,MRCP [facility]) Gastroenterology/Hepatology Pager: [phone]

## 2020-02-27 LAB — POCT I-STAT CREATININE: Creatinine, Ser: 0.7 mg/dL (ref 0.44–1.00)

## 2020-05-06 ENCOUNTER — Other Ambulatory Visit: Payer: Self-pay | Admitting: Family Medicine

## 2020-05-06 DIAGNOSIS — Z1231 Encounter for screening mammogram for malignant neoplasm of breast: Secondary | ICD-10-CM

## 2020-08-28 ENCOUNTER — Other Ambulatory Visit: Payer: Self-pay

## 2020-08-28 DIAGNOSIS — K862 Cyst of pancreas: Secondary | ICD-10-CM

## 2020-09-16 ENCOUNTER — Ambulatory Visit: Payer: Medicare PPO

## 2020-10-01 ENCOUNTER — Ambulatory Visit
Admission: RE | Admit: 2020-10-01 | Discharge: 2020-10-01 | Disposition: A | Discharge status: Home / Self Care | Payer: Medicare PPO | Source: Ambulatory Visit | Attending: Gastroenterology | Admitting: Gastroenterology

## 2020-10-01 ENCOUNTER — Other Ambulatory Visit: Payer: Self-pay

## 2020-10-01 ENCOUNTER — Other Ambulatory Visit: Payer: Self-pay | Admitting: Gastroenterology

## 2020-10-01 DIAGNOSIS — K862 Cyst of pancreas: Secondary | ICD-10-CM | POA: Diagnosis present

## 2020-10-01 MED ORDER — GADOBUTROL 1 MMOL/ML IV SOLN
9.0000 mL | Freq: Once | INTRAVENOUS | Status: AC | PRN
  Administered 2020-10-01: 9 mL via INTRAVENOUS

## 2020-10-19 ENCOUNTER — Encounter: Payer: Self-pay | Admitting: Gastroenterology

## 2020-10-20 ENCOUNTER — Telehealth: Payer: Self-pay

## 2020-10-20 NOTE — Telephone Encounter (Signed)
Pt has been notified of results and Dr. Anna's recommendations. 

## 2020-11-06 ENCOUNTER — Ambulatory Visit
Admission: RE | Admit: 2020-11-06 | Discharge: 2020-11-06 | Disposition: A | Discharge status: Home / Self Care | Payer: Medicare PPO | Source: Ambulatory Visit | Attending: Family Medicine | Admitting: Family Medicine

## 2020-11-06 ENCOUNTER — Other Ambulatory Visit: Payer: Self-pay | Admitting: Family Medicine

## 2020-11-06 DIAGNOSIS — M47816 Spondylosis without myelopathy or radiculopathy, lumbar region: Secondary | ICD-10-CM | POA: Insufficient documentation

## 2020-11-06 DIAGNOSIS — I700 Atherosclerosis of aorta: Secondary | ICD-10-CM | POA: Insufficient documentation

## 2020-11-06 DIAGNOSIS — W19XXXA Unspecified fall, initial encounter: Secondary | ICD-10-CM | POA: Diagnosis not present

## 2020-11-06 DIAGNOSIS — M545 Low back pain, unspecified: Secondary | ICD-10-CM

## 2020-11-30 ENCOUNTER — Telehealth: Payer: Self-pay | Admitting: Gastroenterology

## 2020-11-30 DIAGNOSIS — K625 Hemorrhage of anus and rectum: Secondary | ICD-10-CM

## 2020-11-30 NOTE — Telephone Encounter (Signed)
Reassure her that last colonoscopy was just 1 year back , suggest cbc check and office visit next week on a day I have few endo procedures can see after last one

## 2020-11-30 NOTE — Telephone Encounter (Signed)
Patient states she passed blood with film in it and is concerned. Pt offered Dr. Georgeann Oppenheim next available on 1.31.22 but states since her sister died of colon polyps and she has a hx of them she thinks this is an urgent matter. Pt wants advice on what to do.

## 2020-12-01 NOTE — Telephone Encounter (Signed)
Patient scheduled for 11.23.21 @11AM . FYI

## 2020-12-01 NOTE — Telephone Encounter (Signed)
Pt has been notified of Dr. Georgeann Oppenheim recommendations. Pt agrees to lab test and seeing Dr. Vicente Males next week in the office.

## 2020-12-02 LAB — CBC
Hematocrit: 41.3 % (ref 34.0–46.6)
Hemoglobin: 14 g/dL (ref 11.1–15.9)
MCH: 31.3 pg (ref 26.6–33.0)
MCHC: 33.9 g/dL (ref 31.5–35.7)
MCV: 92 fL (ref 79–97)
Platelets: 237 10*3/uL (ref 150–450)
RBC: 4.47 x10E6/uL (ref 3.77–5.28)
RDW: 12.1 % (ref 11.7–15.4)
WBC: 10.2 10*3/uL (ref 3.4–10.8)

## 2020-12-08 ENCOUNTER — Encounter: Payer: Self-pay | Admitting: Gastroenterology

## 2020-12-10 ENCOUNTER — Ambulatory Visit (INDEPENDENT_AMBULATORY_CARE_PROVIDER_SITE_OTHER): Payer: Medicare PPO | Admitting: Gastroenterology

## 2020-12-10 VITALS — BP 143/84 | HR 50 | Temp 97.8°F | Ht 67.0 in | Wt 202.0 lb

## 2020-12-10 DIAGNOSIS — K625 Hemorrhage of anus and rectum: Secondary | ICD-10-CM | POA: Diagnosis not present

## 2020-12-10 DIAGNOSIS — K862 Cyst of pancreas: Secondary | ICD-10-CM

## 2020-12-10 NOTE — Progress Notes (Signed)
Jonathon Bellows MD, MRCP(U.K) [address]  [place]  [place], [place] [address]  Main: [phone]  Fax: [phone]   Primary Care Physician: Marguerita Merles, MD  Primary Gastroenterologist:  Dr. Jonathon Bellows   Rectal bleeding   HPI: Jenny Hensley is a 69 y.o. female    Summary of history :  She was initially referred and seen in October 2020 for a pancreatic cyst that was incidentally found during work-up for hematuria.a 1.5 cm low-attenuation lesion in the posterior pancreatic head suspicious for cystic pancreatic lesion possible neoplasm.  Follow-up MRI on 08/22/2019 showed a 2.4 cystic lesion with multiple internal septations possibilities include serous cystadenoma versus intraductal papillary mucous neoplasm or postinflammatory cystic lesion.  She had no abdominal pain or prior history of pancreatitis, no family history of pancreatic cancer or recurrent pancreatitis.  She is a smoker.  In May 2019 I performed a colonoscopy on her and found a large 20 mm polyp in the ascending colon which was resected via EMR taken out piecemeal and was a sessile serrated adenoma.  Suggested a repeat colonoscopy in 6 months but she did not follow-up.  Interval history 10/01/2019-12/10/2020  10/01/2020: MR abdomen with MRCP: Signs of acute on chronic pancreatitis seen motion artifact also noted.  The pancreatic lesion has decreased in size and radiologist recommended reevaluation in 3 months.  Prominent upper abdominal lymph nodes likely reactive.  10/21/2019: Colonoscopy: 2 sessile polyps were found in the descending colon and 2 polyps were found in the ascending and cecum as well which were all less than 10 mm in size.  A 5 mm polyp was also seen in the cecum advised endoscopy was normal.  2 of the polyps were tubular adenomas  12/01/2020: Hemoglobin 14  Today she is here to see me for rectal bleeding.  It happened on one isolated episode a few weeks back.  She had a couple of normal bowel  movements and then noticed she had blood in her undergarments associated with some mucus in stool.  Did not recur afterwards.  Denies any perianal discomfort.  She has a history of external hemorrhoids.  Denies any constipation presently but she does take stool softeners.    Current Outpatient Medications  Medication Sig Dispense Refill  . acetaminophen (TYLENOL 8 HOUR ARTHRITIS PAIN) 650 MG CR tablet Take 1,950 mg by mouth 2 (two) times daily as needed for pain.    Marland Kitchen aspirin EC 81 MG tablet Take 81 mg by mouth daily.    Marland Kitchen buPROPion (WELLBUTRIN XL) 150 MG 24 hr tablet Take 150 mg by mouth daily.     . cetirizine (ZYRTEC ALLERGY) 10 MG tablet Take 1 tablet (10 mg total) by mouth daily. (Patient not taking: Reported on 10/21/2019) 30 tablet 10  . Dulaglutide (TRULICITY) 1.57 WI/2.0BT SOPN Trulicity 5.97 CB/6.3 mL subcutaneous pen injector    . ezetimibe (ZETIA) 10 MG tablet Take 10 mg by mouth daily.    . fluticasone (FLONASE) 50 MCG/ACT nasal spray Place 1-2 sprays into both nostrils daily as needed for allergies or rhinitis.    Marland Kitchen glyBURIDE (DIABETA) 5 MG tablet Take 10 mg by mouth 2 (two) times daily.    Marland Kitchen ibuprofen (ADVIL,MOTRIN) 200 MG tablet Take 400 mg by mouth 2 (two) times daily as needed (FOR PAIN.).    . Insulin Detemir (LEVEMIR FLEXTOUCH) 100 UNIT/ML Pen Inject 64 Units into the skin 2 (two) times daily.     . insulin lispro (HUMALOG KWIKPEN) 100 UNIT/ML  KiwkPen Inject 20-40 Units into the skin 5 (five) times daily. SLIDING SCALE INSULIN    . ketoconazole (NIZORAL) 2 % cream ketoconazole 2 % topical cream    . lactulose (CHRONULAC) 10 GM/15ML solution     . linagliptin (TRADJENTA) 5 MG TABS tablet Take 5 mg by mouth daily.    Marland Kitchen lisinopril-hydrochlorothiazide (PRINZIDE,ZESTORETIC) 10-12.5 MG tablet Take 1 tablet by mouth daily.    . metFORMIN (GLUCOPHAGE) 500 MG tablet     . metoprolol tartrate (LOPRESSOR) 25 MG tablet Take 1 tablet (25 mg total) by mouth 2 (two) times daily. 60 tablet 1   . mometasone (ELOCON) 0.1 % lotion mometasone 0.1 % topical solution    . Multiple Vitamins-Minerals (CENTRUM SILVER 50+WOMEN) TABS Take 1 tablet by mouth daily at 6 (six) AM.    . omeprazole (PRILOSEC) 40 MG capsule Take 40 mg by mouth daily at 6 (six) AM.    . ondansetron (ZOFRAN) 4 MG tablet Take 4 mg by mouth every 6 (six) hours.    Marland Kitchen oxybutynin (DITROPAN) 5 MG tablet Take 5 mg by mouth 2 (two) times daily.    . rivaroxaban (XARELTO) 20 MG TABS tablet Take 20 mg by mouth daily.    Manus Gunning BOWEL PREP KIT 17.5-3.13-1.6 GM/177ML SOLN     . Zoster Vaccine Live, PF, (ZOSTAVAX) 19622 UNT/0.65ML injection Zostavax (PF) 19,400 unit/0.65 mL subcutaneous suspension     No current facility-administered medications for this visit.    Allergies as of 12/10/2020 - Review Complete 12/10/2020  Allergen Reaction Noted  . Ibuprofen Hives 09/20/2016  . Nsaids Hives 05/06/2017    ROS:  General: Negative for anorexia, weight loss, fever, chills, fatigue, weakness. ENT: Negative for hoarseness, difficulty swallowing , nasal congestion. CV: Negative for chest pain, angina, palpitations, dyspnea on exertion, peripheral edema.  Respiratory: Negative for dyspnea at rest, dyspnea on exertion, cough, sputum, wheezing.  GI: See history of present illness. GU:  Negative for dysuria, hematuria, urinary incontinence, urinary frequency, nocturnal urination.  Endo: Negative for unusual weight change.    Physical Examination:   BP (!) 143/84   Pulse (!) 50   Temp 97.8 F (36.6 C)   Ht '5\' 7"'  (1.702 m)   Wt 202 lb (91.6 kg)   BMI 31.64 kg/m   General: Well-nourished, well-developed in no acute distress.  Eyes: No icterus. Conjunctivae pink. Neuro: Alert and oriented x 3.  Grossly intact. Skin: Warm and dry, no jaundice.   Psych: Alert and cooperative, normal mood and affect.   Imaging Studies: No results found.  Assessment and Plan:   Jenny Hensley is a 69 y.o. y/o female with a prior history  of a pancreatic lesion on a CT scan for work-up of hematuria.  The follow-up MRI shows a pancreatic lesion with internal septations.  She does not have any abdominal pain or weight loss.  She is a smoker.  She is here to see me for rectal bleeding.  Explained very likely bleeding was isolated and from probably hemorrhoid which is aggravated due to multiple bowel movements.  Explained we have 2 options which is either to go ahead with a sigmoidoscopy or watch and wait.  She chose to watch and wait.   Plan 1.  MRI of the abdomen with MRCP in January 2022 to evaluate pancreatic cyst 2.  If rectal bleeding recurs will perform sigmoidoscopy right after the episode to determine if it is hemorrhoidal.  Discussed about daily anal hygiene and provided information on conservative  management of internal hemorrhoids.  Dr Jonathon Bellows  MD,MRCP [facility]) Follow up in 4 to 6 weeks telephone visit

## 2021-01-13 ENCOUNTER — Other Ambulatory Visit: Payer: Self-pay | Admitting: Gastroenterology

## 2021-01-13 ENCOUNTER — Other Ambulatory Visit: Payer: Self-pay

## 2021-01-13 ENCOUNTER — Telehealth: Payer: Self-pay | Admitting: Gastroenterology

## 2021-01-13 DIAGNOSIS — K862 Cyst of pancreas: Secondary | ICD-10-CM

## 2021-01-13 NOTE — Telephone Encounter (Signed)
Patient has been schedule for the MRCP at [facility] on 01/20/21 at [place] at 6:30pm at the [facility] entrance. NPO 4 hours before test. Pt verbalized understanding.

## 2021-01-13 NOTE — Telephone Encounter (Signed)
Patient called to ask when she can schedule her MRI, she dasy she is due for one in January, please advise

## 2021-01-20 ENCOUNTER — Other Ambulatory Visit: Payer: Self-pay

## 2021-01-20 ENCOUNTER — Ambulatory Visit
Admission: RE | Admit: 2021-01-20 | Discharge: 2021-01-20 | Disposition: A | Discharge status: Home / Self Care | Payer: Medicare PPO | Source: Ambulatory Visit | Attending: Gastroenterology | Admitting: Gastroenterology

## 2021-01-20 ENCOUNTER — Other Ambulatory Visit: Payer: Self-pay | Admitting: Gastroenterology

## 2021-01-20 DIAGNOSIS — K862 Cyst of pancreas: Secondary | ICD-10-CM | POA: Diagnosis present

## 2021-01-20 MED ORDER — GADOBUTROL 1 MMOL/ML IV SOLN
9.0000 mL | Freq: Once | INTRAVENOUS | Status: AC | PRN
  Administered 2021-01-20: 9 mL via INTRAVENOUS

## 2021-01-23 IMAGING — MR MR ABDOMEN WO/W CM MRCP
18 of 20 series · 44 of 48 positions shown · IV contrast (gadavist)
Comparison: Abdominal MRI 08/22/2019.

CLINICAL DATA: 69-year-old female with history of cystic lesion of
the pancreas. Follow-up study.

EXAM:
MRI ABDOMEN WITHOUT AND WITH CONTRAST (INCLUDING MRCP)
TECHNIQUE: Multiplanar multisequence MR imaging of the abdomen was performed
both before and after the administration of intravenous contrast.
Heavily T2-weighted images of the biliary and pancreatic ducts were
obtained, and three-dimensional MRCP images were rendered by post
processing.
CONTRAST:  9mL GADAVIST GADOBUTROL 1 MMOL/ML IV SOLN

[Series 3: T2 · coronal · 6.5mm · 1.19mm/px · 2 of 30 slices shown (1 of 2)]
[im 1/30]
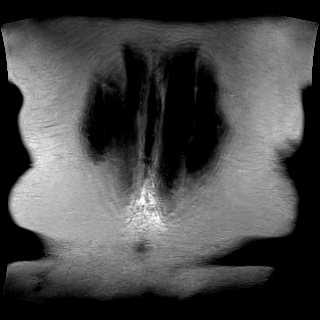
[im 30/30]
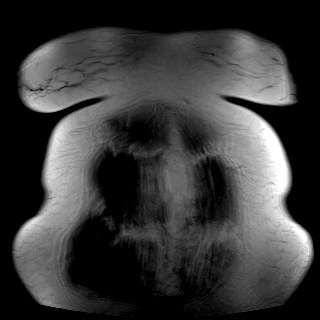

[Series 4: T2 · axial · 6.0mm · 1.19mm/px · 1 of 32 slices shown (2 of 2)]
[im 1/32]
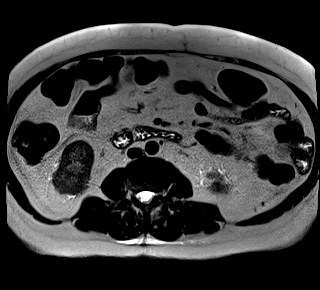

[Series 5: T1 · axial · 6.0mm · 0.74mm/px · z∈[-75,+148]mm · 3 of 64 slices shown]
[im 1/64]
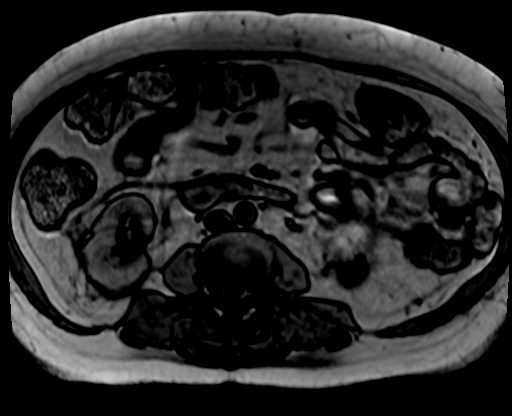
[im 32/64]
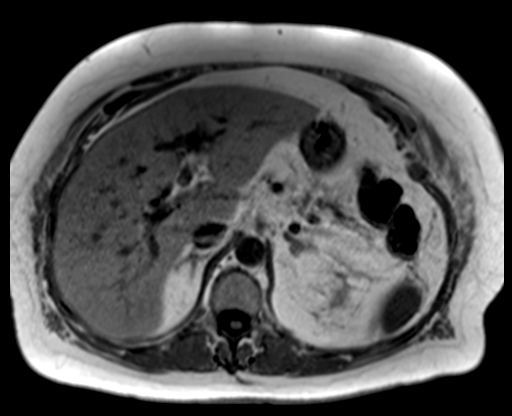
[im 64/64]
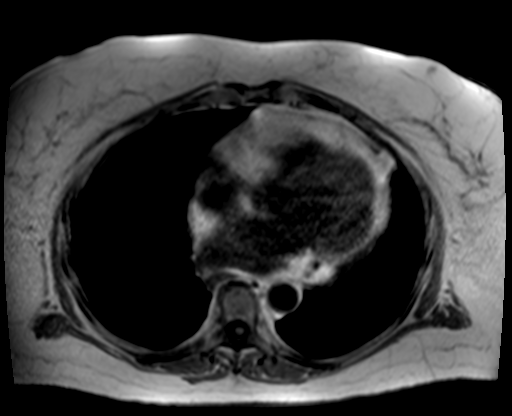

[Series 8: T2 fat-sat · axial · 6.5mm · 1.19mm/px · 1 of 30 slices shown]
[im 1/30]
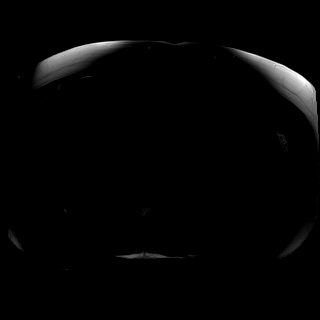

[Series 9: ax dwi_tracew · axial · 6.5mm · 1.42mm/px · z∈[-79,+147]mm · 4 of 90 slices shown]
[im 1/90]
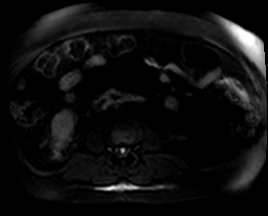
[im 30/90]
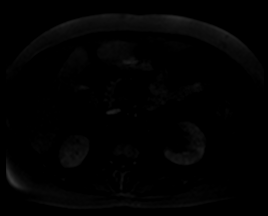
[im 60/90]
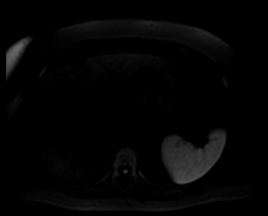
[im 90/90]
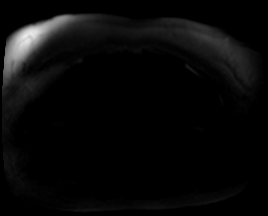

[Series 10: ax dwi_adc · axial · 6.5mm · 1.42mm/px · 1 of 30 slices shown]
[im 1/30]
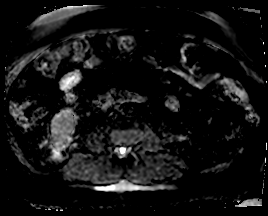

[Series 14: MRCP · coronal · 3.5mm · 1.12mm/px · 1 of 17 slices shown]
[im 1/17]
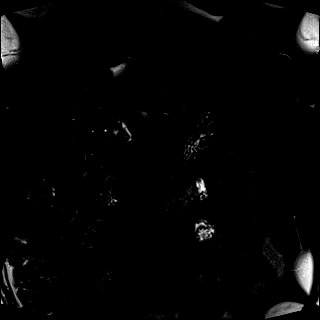

[Series 15: radials · coronal · 50.0mm · 0.78mm/px · 1 of 4 slices shown]
[im 1/4]
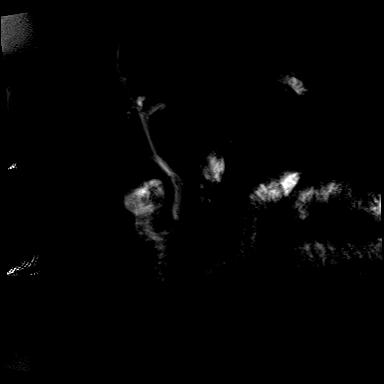

[Series 16: T1 dynamic fat-sat · axial · non-contrast · 3.0mm · 1.19mm/px · z∈[-93,+120]mm · 3 of 72 slices shown (1 of 5)]
[im 1/72]
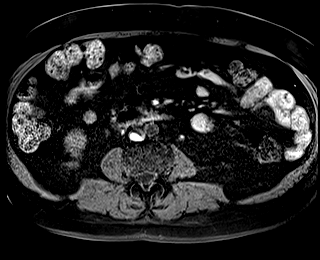
[im 36/72]
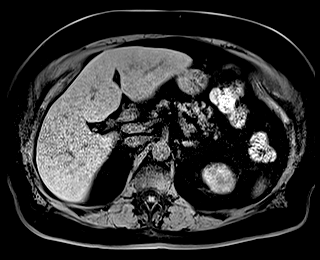
[im 72/72]
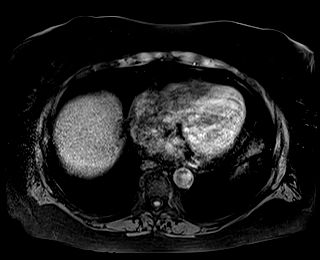

[Series 17: T1 dynamic fat-sat post-contrast · axial · 3.0mm · 1.19mm/px · z∈[-93,+120]mm · 3 of 72 slices shown (1 of 4)]
[im 1/72]
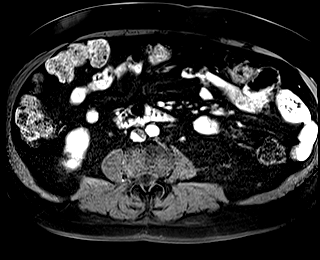
[im 36/72]
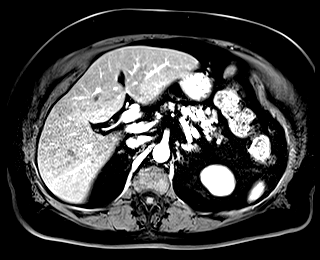
[im 72/72]
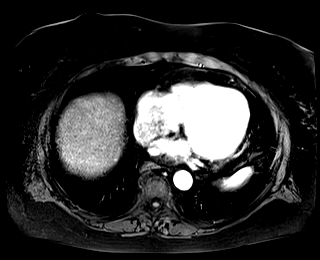

[Series 18: T1 dynamic fat-sat · axial · 3.0mm · 1.19mm/px · z∈[-93,+120]mm · 3 of 72 slices shown (2 of 5)]
[im 1/72]
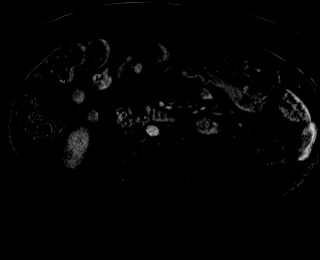
[im 36/72]
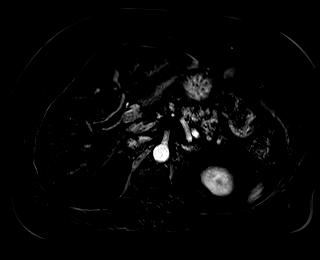
[im 72/72]
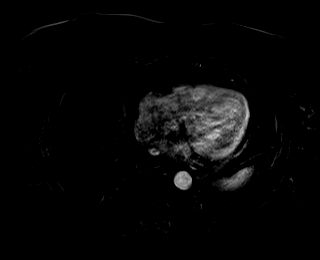

[Series 19: T1 dynamic fat-sat post-contrast · axial · 3.0mm · 1.19mm/px · z∈[-93,+120]mm · 3 of 72 slices shown (2 of 4)]
[im 1/72]
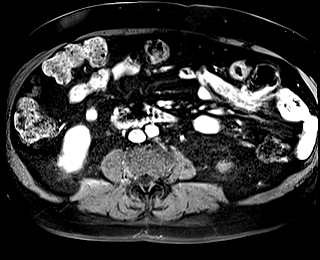
[im 36/72]
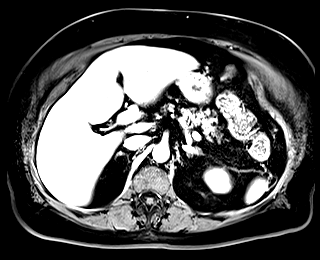
[im 72/72]
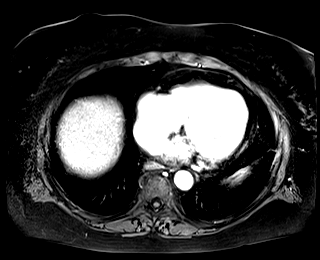

[Series 20: T1 dynamic fat-sat · axial · 3.0mm · 1.19mm/px · z∈[-93,+120]mm · 3 of 72 slices shown (3 of 5)]
[im 1/72]
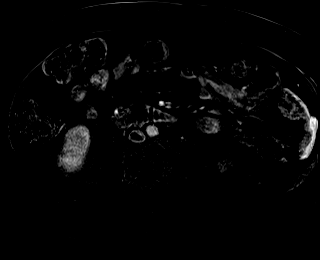
[im 36/72]
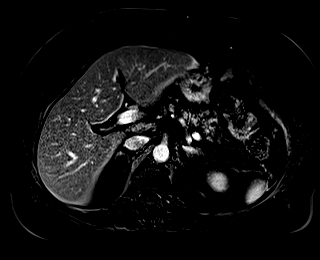
[im 72/72]
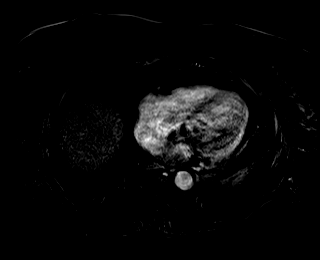

[Series 21: T1 dynamic fat-sat post-contrast · axial · 3.0mm · 1.19mm/px · z∈[-93,+120]mm · 3 of 72 slices shown (3 of 4)]
[im 1/72]
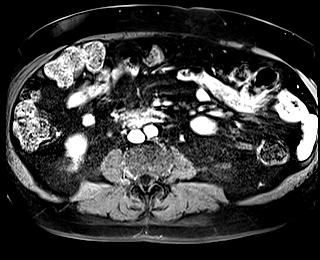
[im 36/72]
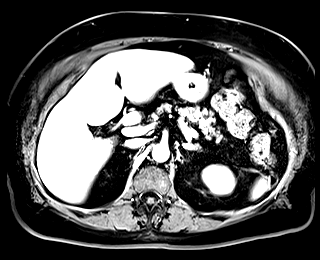
[im 72/72]
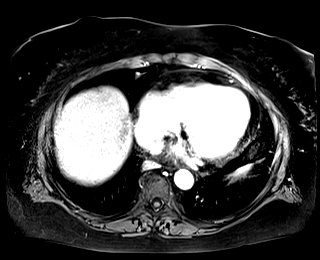

[Series 22: T1 dynamic fat-sat · axial · 3.0mm · 1.19mm/px · z∈[-93,+120]mm · 3 of 72 slices shown (4 of 5)]
[im 1/72]
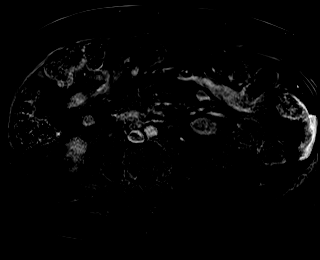
[im 36/72]
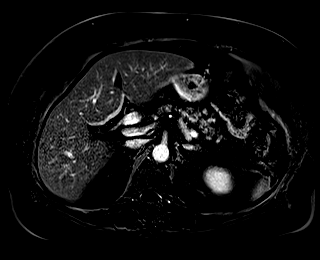
[im 72/72]
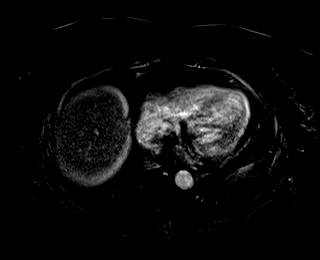

[Series 23: T1 dynamic post-contrast · coronal · 3.5mm · 1.31mm/px · 3 of 72 slices shown]
[im 1/72]
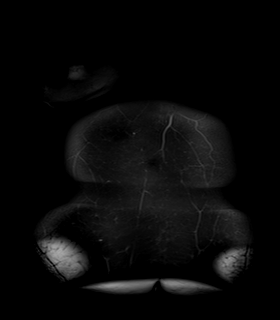
[im 36/72]
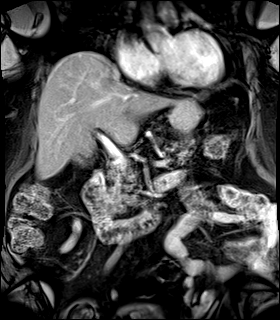
[im 72/72]
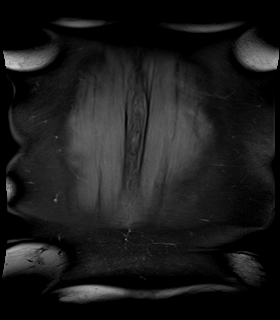

[Series 24: T1 dynamic fat-sat post-contrast · axial · 3.0mm · 1.19mm/px · z∈[-93,+120]mm · 3 of 72 slices shown (4 of 4)]
[im 1/72]
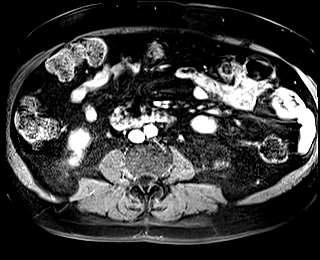
[im 36/72]
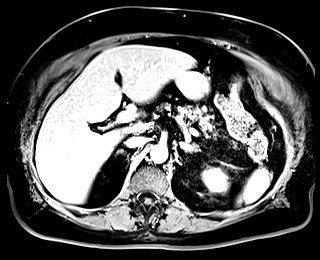
[im 72/72]
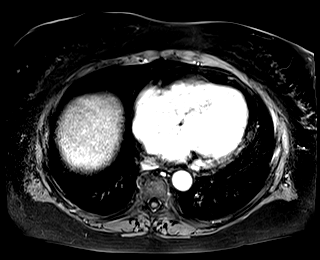

[Series 25: T1 dynamic fat-sat · axial · 3.0mm · 1.19mm/px · z∈[-93,+120]mm · 3 of 72 slices shown (5 of 5)]
[im 1/72]
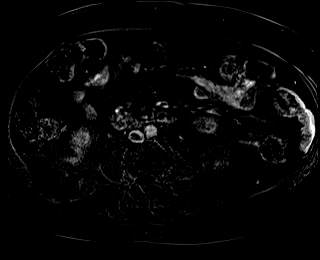
[im 36/72]
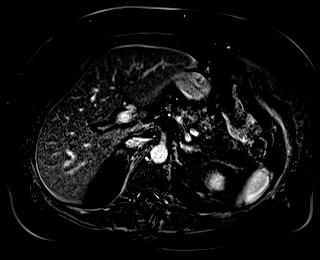
[im 72/72]
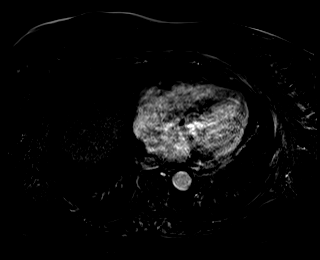

[44 of 48 positions shown; findings below may reference images not displayed]

FINDINGS: Lower chest: Unremarkable.

Hepatobiliary: Diffuse loss of signal intensity throughout the
hepatic parenchyma on out of phase dual echo images, indicative of
hepatic steatosis. No suspicious cystic or solid hepatic lesions. No
intra or extrahepatic biliary ductal dilatation noted on MRCP
images. Common bile duct measures 6 mm in the porta hepatis. Status
post cholecystectomy. No filling defects in the common bile duct to
suggest choledocholithiasis.

Pancreas: In the uncinate process of the pancreas there is a 2.5 x
1.5 x 2.8 cm T1 hypointense, T2 hyperintense lesion which has
multiple thin internal septations without appreciable internal
enhancement on post gadolinium images, similar to the prior study
(axial image 21 of series 4 and coronal image 18 of series 3). Other
smaller subcentimeter T1 hypointense, T2 hyperintense, nonenhancing
lesions in the tail of the pancreas also appear unchanged. Diffuse
pancreatic atrophy. No pancreatic ductal dilatation noted on MRCP
images. No peripancreatic fluid collections or inflammatory changes.

Spleen:  Unremarkable.

Adrenals/Urinary Tract: 2.8 cm T1 hypointense, T2 hyperintense,
nonenhancing lesion in the medial aspect of the upper pole the left
kidney, compatible with a small simple cyst. No
hydroureteronephrosis in the visualized portions of the abdomen.
Bilateral adrenal glands are normal in appearance.

Stomach/Bowel: Visualized portions are unremarkable.

Vascular/Lymphatic: Aortic atherosclerosis, without evidence of
aneurysm in the abdominal vasculature. No lymphadenopathy noted in
the abdomen.

Other: No significant volume of ascites noted in the visualized
portions of the peritoneal cavity.

Musculoskeletal: No aggressive appearing osseous lesions are noted
in the visualized portions of the skeleton.
IMPRESSION: 1. Stable cystic lesions in the uncinate process and tail of the
pancreas. The largest of these is in the uncinate process currently
measuring 2.8 x 1.7 x 2.8 cm. The short-term stability of this
lesion is reassuring and may suggest a benign etiology, however,
continued attention on follow-up imaging is recommended. Repeat
abdominal MRI with and without IV gadolinium with MRCP is
recommended in 6 months to ensure continued stability. This
recommendation follows ACR consensus guidelines: Management of
Incidental Pancreatic Cysts: A White Paper of the ACR Incidental
Findings Committee. [HOSPITAL] 2525;[DATE].
2. Hepatic steatosis.
3. Aortic atherosclerosis.

## 2021-08-31 ENCOUNTER — Other Ambulatory Visit: Payer: Self-pay | Admitting: Family Medicine

## 2021-09-01 ENCOUNTER — Other Ambulatory Visit: Payer: Self-pay | Admitting: Family Medicine

## 2021-09-01 DIAGNOSIS — Z1231 Encounter for screening mammogram for malignant neoplasm of breast: Secondary | ICD-10-CM

## 2021-10-06 ENCOUNTER — Other Ambulatory Visit: Payer: Self-pay

## 2021-10-06 ENCOUNTER — Ambulatory Visit
Admission: RE | Admit: 2021-10-06 | Discharge: 2021-10-06 | Disposition: A | Discharge status: Home / Self Care | Payer: Medicare PPO | Source: Ambulatory Visit | Attending: Family Medicine | Admitting: Family Medicine

## 2021-10-06 DIAGNOSIS — Z1231 Encounter for screening mammogram for malignant neoplasm of breast: Secondary | ICD-10-CM | POA: Insufficient documentation

## 2021-12-28 ENCOUNTER — Other Ambulatory Visit: Admission: RE | Admit: 2021-12-28 | Payer: Medicare PPO | Source: Home / Self Care | Admitting: *Deleted

## 2021-12-28 ENCOUNTER — Other Ambulatory Visit: Payer: Self-pay

## 2022-06-29 ENCOUNTER — Telehealth: Payer: Self-pay

## 2022-06-29 DIAGNOSIS — K862 Cyst of pancreas: Secondary | ICD-10-CM

## 2022-06-29 NOTE — Telephone Encounter (Signed)
Patient called wanting to know if she needs a follow up with you because she believes that it's time to do a MRI (MRCP Abdomen). Patient also stated that her PCP told her that her liver enzymes were elevated so if she needed to do a MRI of abdomen, if you could also add the liver. Please advise. Appointment with you? Or ordering MRI (MRCP Abdomen and Liver).

## 2022-06-30 NOTE — Telephone Encounter (Signed)
Yes MRI abdomen to evaluate pancreatic cysts needed- it would atuomatically include liver- I cannot see any abnormal liver enzymes recently- suggest to send in report

## 2022-07-01 NOTE — Telephone Encounter (Signed)
Called patient to let her know that Dr. Vicente Males ordered the MRI and it had been scheduled. Patient agreed on going.

## 2022-07-04 ENCOUNTER — Encounter: Payer: Self-pay | Admitting: Family Medicine

## 2022-07-05 ENCOUNTER — Encounter: Payer: Self-pay | Admitting: Family Medicine

## 2022-07-11 ENCOUNTER — Ambulatory Visit: Admission: RE | Admit: 2022-07-11 | Payer: Medicare PPO | Source: Ambulatory Visit

## 2022-07-18 ENCOUNTER — Ambulatory Visit
Admission: RE | Admit: 2022-07-18 | Discharge: 2022-07-18 | Disposition: A | Discharge status: Home / Self Care | Payer: Medicare PPO | Source: Ambulatory Visit | Attending: Gastroenterology | Admitting: Gastroenterology

## 2022-07-18 ENCOUNTER — Other Ambulatory Visit: Payer: Self-pay | Admitting: Gastroenterology

## 2022-07-18 DIAGNOSIS — K862 Cyst of pancreas: Secondary | ICD-10-CM | POA: Insufficient documentation

## 2022-07-18 MED ORDER — GADOBUTROL 1 MMOL/ML IV SOLN
9.0000 mL | Freq: Once | INTRAVENOUS | Status: AC | PRN
  Administered 2022-07-18: 9 mL via INTRAVENOUS

## 2022-07-21 NOTE — Progress Notes (Signed)
Inform LFT's elevated - needs labs for full viral hepatitis and autoimmune work up

## 2022-07-21 NOTE — Progress Notes (Signed)
Inform pancreatic cyst stable: repeat MRI in 1 year , place reminder

## 2022-07-25 ENCOUNTER — Telehealth: Payer: Self-pay

## 2022-07-25 DIAGNOSIS — R7989 Other specified abnormal findings of blood chemistry: Secondary | ICD-10-CM

## 2022-07-25 NOTE — Telephone Encounter (Signed)
I think it is these labs please ginger make sure these are it

## 2022-07-25 NOTE — Telephone Encounter (Signed)
Ladies, do you know what labs to order? Please read below. Thank you!

## 2022-07-25 NOTE — Telephone Encounter (Signed)
-----   Message from Jonathon Bellows, MD sent at 07/21/2022 11:44 PM EDT ----- Inform LFT's elevated - needs labs for full viral hepatitis and autoimmune work up

## 2022-07-25 NOTE — Telephone Encounter (Signed)
These are the labs

## 2022-07-28 NOTE — Telephone Encounter (Signed)
Patient was contacted to let her know that she needed additional labs since her LFTs were elevated. Patient agreed. However, she stated that she will have them drawn until next week.

## 2022-08-01 ENCOUNTER — Telehealth: Payer: Self-pay | Admitting: Gastroenterology

## 2022-08-01 NOTE — Telephone Encounter (Signed)
Patient is on the way to get lab work done, she wanted to call and make sure that the order is sent to [facility] across from [employer].

## 2022-08-01 NOTE — Telephone Encounter (Signed)
Labs have been ordered since last week after speaking to the patient.

## 2022-08-04 LAB — HEPATITIS B CORE ANTIBODY, TOTAL

## 2022-08-04 LAB — ANA: ANA Titer 1: NEGATIVE

## 2022-08-04 LAB — ANTI-SMOOTH MUSCLE ANTIBODY, IGG: Smooth Muscle Ab: 16 Units (ref 0–19)

## 2022-08-04 LAB — ALPHA-1-ANTITRYPSIN: A-1 Antitrypsin: 152 mg/dL (ref 101–187)

## 2022-08-04 LAB — HEPATITIS A ANTIBODY, TOTAL

## 2022-08-04 LAB — HEPATITIS B SURFACE ANTIBODY,QUALITATIVE

## 2022-08-04 LAB — MITOCHONDRIAL ANTIBODIES: Mitochondrial Ab: <20 Units (ref 0.0–20.0)

## 2022-08-04 LAB — HEPATITIS B SURFACE ANTIGEN

## 2022-08-04 LAB — HEPATITIS C ANTIBODY

## 2022-08-04 LAB — CERULOPLASMIN: Ceruloplasmin: 30.9 mg/dL (ref 19.0–39.0)

## 2024-04-30 ENCOUNTER — Other Ambulatory Visit: Payer: Self-pay | Admitting: Family Medicine

## 2024-04-30 DIAGNOSIS — Z1231 Encounter for screening mammogram for malignant neoplasm of breast: Secondary | ICD-10-CM

## 2024-05-09 ENCOUNTER — Ambulatory Visit
Admission: RE | Admit: 2024-05-09 | Discharge: 2024-05-09 | Disposition: A | Discharge status: Home / Self Care | Source: Ambulatory Visit | Attending: Family Medicine | Admitting: Family Medicine

## 2024-05-09 DIAGNOSIS — Z1231 Encounter for screening mammogram for malignant neoplasm of breast: Secondary | ICD-10-CM | POA: Diagnosis present
